# Patient Record
Sex: Female | Born: 1935 | Race: White | Hispanic: No | Marital: Married | Smoking: Former smoker
Health system: Southern US, Community
[De-identification: ages and names within clinical notes are randomized; demographics above are authoritative.]

## PROBLEM LIST (undated history)

## (undated) DIAGNOSIS — G459 Transient cerebral ischemic attack, unspecified: Secondary | ICD-10-CM

## (undated) DIAGNOSIS — M1990 Unspecified osteoarthritis, unspecified site: Secondary | ICD-10-CM

## (undated) DIAGNOSIS — J45909 Unspecified asthma, uncomplicated: Secondary | ICD-10-CM

## (undated) DIAGNOSIS — I4510 Unspecified right bundle-branch block: Secondary | ICD-10-CM

## (undated) HISTORY — PX: CHOLECYSTECTOMY: SHX55

## (undated) HISTORY — PX: DILATION AND CURETTAGE OF UTERUS: SHX78

## (undated) HISTORY — PX: TONSILLECTOMY: SUR1361

---

## 2012-01-12 ENCOUNTER — Emergency Department (HOSPITAL_COMMUNITY): Payer: Medicare Other

## 2012-01-12 ENCOUNTER — Encounter (HOSPITAL_COMMUNITY): Payer: Self-pay | Admitting: Emergency Medicine

## 2012-01-12 ENCOUNTER — Emergency Department (HOSPITAL_COMMUNITY)
Admission: EM | Admit: 2012-01-12 | Discharge: 2012-01-12 | Disposition: A | Discharge status: Home / Self Care | Payer: Medicare Other | Attending: Emergency Medicine | Admitting: Emergency Medicine

## 2012-01-12 DIAGNOSIS — Z87891 Personal history of nicotine dependence: Secondary | ICD-10-CM | POA: Insufficient documentation

## 2012-01-12 DIAGNOSIS — R200 Anesthesia of skin: Secondary | ICD-10-CM

## 2012-01-12 DIAGNOSIS — R209 Unspecified disturbances of skin sensation: Secondary | ICD-10-CM | POA: Insufficient documentation

## 2012-01-12 DIAGNOSIS — R202 Paresthesia of skin: Secondary | ICD-10-CM

## 2012-01-12 DIAGNOSIS — M129 Arthropathy, unspecified: Secondary | ICD-10-CM | POA: Insufficient documentation

## 2012-01-12 DIAGNOSIS — J45909 Unspecified asthma, uncomplicated: Secondary | ICD-10-CM | POA: Insufficient documentation

## 2012-01-12 HISTORY — DX: Unspecified asthma, uncomplicated: J45.909

## 2012-01-12 HISTORY — DX: Unspecified osteoarthritis, unspecified site: M19.90

## 2012-01-12 LAB — CBC WITH DIFFERENTIAL/PLATELET
Basophils Absolute: 0 10*3/uL (ref 0.0–0.1)
Basophils Relative: 0 % (ref 0–1)
Lymphocytes Relative: 23 % (ref 12–46)
MCHC: 34.5 g/dL (ref 30.0–36.0)
Monocytes Absolute: 0.6 10*3/uL (ref 0.1–1.0)
Neutro Abs: 3.8 10*3/uL (ref 1.7–7.7)
Neutrophils Relative %: 64 % (ref 43–77)
Platelets: 287 10*3/uL (ref 150–400)
RDW: 12.7 % (ref 11.5–15.5)
WBC: 6 10*3/uL (ref 4.0–10.5)

## 2012-01-12 LAB — BASIC METABOLIC PANEL
CO2: 24 mEq/L (ref 19–32)
Chloride: 99 mEq/L (ref 96–112)
Creatinine, Ser: 0.66 mg/dL (ref 0.50–1.10)
GFR calc Af Amer: 88 mL/min — ABNORMAL LOW (ref >90)
Potassium: 4 mEq/L (ref 3.5–5.1)
Sodium: 136 mEq/L (ref 135–145)

## 2012-01-12 LAB — APTT: aPTT: 30 seconds (ref 24–37)

## 2012-01-12 LAB — POCT I-STAT TROPONIN I: Troponin i, poc: 0 ng/mL (ref 0.00–0.08)

## 2012-01-12 LAB — PROTIME-INR: Prothrombin Time: 12.5 seconds (ref 11.6–15.2)

## 2012-01-12 NOTE — ED Notes (Signed)
Pt presents to department for evaluation of tingling/numbness to R side of body. Pt reports numbness to R side of face, neck arms, and legs. Onset while at the movie theatre. No neurological deficits noted at the time. Strong equal bilateral grip strengths. No facial droop noted. Speech clear. Denies pain at the time. Pt is conscious alert and oriented x4.

## 2012-01-12 NOTE — ED Notes (Signed)
Pt discharged home. Had no further questions. 

## 2012-01-12 NOTE — ED Provider Notes (Signed)
History     CSN: [id number]  Arrival date & time 01/12/12  1427   First MD Initiated Contact with Patient 01/12/12 1517      Chief Complaint  Patient presents with  . Numbness    (Consider location/radiation/quality/duration/timing/severity/associated sxs/prior treatment) HPI Sudden onset of right arm numbness while watching a movie with family.  She said she all the sudden she didn't feel well. Family who was with patient states she "didn't look well." Pt denies any pain. The location of the patient's problem is right arm.  Onset was 1-2 hours ago with resolved course since that time.   Modifying factors:  none.  Associated symptoms: no chest pain, no N/V, no arm or leg weakness, no difficulty speaking.   Pt is from [place] visiting family here and has a cardiologist there. Family reports nuclear medicine stress test approximately two years ago reported to be unremarkable. Pt is on plavix for h/o of TIA.    Past Medical History  Diagnosis Date  . Asthma   . Arthritis     Past Surgical History  Procedure Date  . Dilation and curettage of uterus   . Cholecystectomy   . Tonsillectomy     No family history on file.  History  Substance Use Topics  . Smoking status: Former Games developer  . Smokeless tobacco: Not on file  . Alcohol Use: Yes    OB History    Grav Para Term Preterm Abortions TAB SAB Ect Mult Living                  Review of Systems Negative for respiratory distress, cough. Negative for vomiting, diarrhea.  All other systems reviewed and negative unless noted in HPI.    Allergies  Review of patient's allergies indicates not on file.  Home Medications  No current outpatient prescriptions on file.  BP 166/70  Pulse 87  Temp 97.7 F (36.5 C) (Oral)  Resp 18  SpO2 97%  Physical Exam Nursing note and vitals reviewed.  Constitutional: Pt is alert and appears stated age. Oropharynx: Airway open without erythema or exudate. Respiratory: No  respiratory distress. Equal breathing bilaterally. CV: Extremities warm and well perfused. Neuro: GCS 15. No motor nor sensory deficit. Normal coordination. CN II-XII intact.  Head: Normocephalic and atraumatic. Eyes: No conjunctivitis, no scleral icterus. Neck: Supple, no mass. Chest: Non-tender. Abdomen: Soft, non-tender MSK: Extremities are atraumatic without deformity. Skin: No rash, no wounds.  ED Course  Procedures (including critical care time)   Labs Reviewed  CBC WITH DIFFERENTIAL  APTT  PROTIME-INR  POCT I-STAT TROPONIN I  BASIC METABOLIC PANEL  URINALYSIS, ROUTINE W REFLEX MICROSCOPIC   Dg Chest 2 View  01/12/2012  *RADIOLOGY REPORT*  Clinical Data: Right arm numbness  CHEST - 2 VIEW  Comparison: None.  Findings: Cardiomediastinal silhouette is unremarkable.  Mild hyperinflation.  Mild degenerative changes thoracic spine. Tortuous descending aorta.  Metallic anchors are noted right proximal humerus. No acute infiltrate or pulmonary edema.  IMPRESSION: No acute disease.  Mild hyperinflation.   Original Report Authenticated By: Natasha Mead, M.D.    Ct Head Wo Contrast  01/12/2012  *RADIOLOGY REPORT*  Clinical Data: Numbness and tingling to right side of face and right upper extremity.  CT HEAD WITHOUT CONTRAST  Technique:  Contiguous axial images were obtained from the base of the skull through the vertex without contrast.  Comparison: None.  Findings: The brain shows cortical atrophy, primarily affecting the frontal lobes bilaterally.  The brain demonstrates  no evidence of hemorrhage, infarction, edema, mass effect, extra-axial fluid collection, hydrocephalus or mass lesion.  The skull is unremarkable.  IMPRESSION: No acute findings.   Original Report Authenticated By: Irish Lack, M.D.      1. Numbness and tingling       MDM  78 y.o. female here with numbness. No weakness, slurred speech, or AMS. Also, no chest pain or SOB.  Pertinent past problems include TIA on  plavix, HTN, HL. Currently at baseline. Work up completed with unremarkable EKG, CXR, CT head, labs. Plan to do second troponin.   EKG ordered and interpreted by me:  Date: 01/12/2012  Rate: 64  Rhythm: normal sinus rhythm  QRS Axis: normal  Intervals: normal  ST/T Wave abnormalities: normal right bundle-branch block, left atrial hypertrophy. No prior ECG available for comparison.  Conduction Disutrbances:right bundle branch block  Narrative Interpretation:  Old EKG Reviewed: none available    Course of care: Second troponin returned low. On re-eval, pt resting comfortably, NAD. Discussed results with pt and family member at bedside. They will be able to obtain close follow up care. Feel pt is stable for d/c. Counseling provided regarding diagnosis, treatment plan, follow up recommendations, and return precautions. Questions answered.   Medical Decision Making discussed with ED attending Dione Booze, MD          Charm Barges, MD 01/13/12 0005

## 2012-01-12 NOTE — ED Notes (Signed)
Pt in x-ray at the time. 

## 2012-01-12 NOTE — ED Provider Notes (Signed)
78 year old female developed numbness in the right arm while in a movie theater. Numbness lasted about 20-30 minutes before resolving. There is also a flushed sensation in her face which has not resulted denies chest pain, heaviness, tightness, pressure. She denies dyspnea. She denies weakness. She did have a TIA approximately 4 months ago which was manifest by right eye vision changes. Work appears unremarkable. Exam shows normal neurologic exam including no pronator drift and no extinction on double simultaneous stimulation. Fundi show no hemorrhage, exudate, or papilledema. There no carotid bruits. She will have troponin rechecked and if normal, discharged to follow up with her PCP in [place].   Date: 01/12/2012  Rate: 64  Rhythm: normal sinus rhythm  QRS Axis: normal  Intervals: normal  ST/T Wave abnormalities: normal right bundle-branch block, left atrial hypertrophy. No prior ECG available for comparison.  Conduction Disutrbances:right bundle branch block  Narrative Interpretation:   Old EKG Reviewed: none available  I saw and evaluated the patient, reviewed the resident's note and I agree with the findings and plan.   Dione Booze, MD 01/12/12 [phone]

## 2012-01-12 NOTE — ED Notes (Signed)
Pt reports numbness and tingling to right side of face. Pt also reports tingling in right arm and hands. Pt talking in complete sentences without difficulty. Dr. Anitra Lauth in Triage room evaluating patient.

## 2012-01-12 NOTE — ED Provider Notes (Signed)
Patient presenting with right arm numbness that started less than 1 hour ago. She denies any weakness or numbness in the face or leg. On exam she has normal strength and states she was ambulating normally. She has some mild neck tenderness but denies any chest pain. At this point in time do not feel that patient is a code stroke as her only her right arm is affected there is no weakness and no gait abnormalities. Will do a workup with a CBC, BMP, i-STAT troponin, UA, chest x-ray, EKG and head CT for further evaluation for silent cardiac cause versus neurologic.  Gwyneth Sprout, MD 01/12/12 [phone]

## 2012-01-12 NOTE — ED Notes (Signed)
Pt unable to void at the time. 

## 2013-04-22 ENCOUNTER — Encounter (HOSPITAL_COMMUNITY): Payer: Self-pay | Admitting: Emergency Medicine

## 2013-04-22 ENCOUNTER — Observation Stay (HOSPITAL_COMMUNITY)
Admission: EM | Admit: 2013-04-22 | Discharge: 2013-04-25 | Disposition: A | Discharge status: Home / Self Care | Payer: Medicare Other | Attending: Family Medicine | Admitting: Family Medicine

## 2013-04-22 ENCOUNTER — Emergency Department (HOSPITAL_COMMUNITY): Payer: Medicare Other

## 2013-04-22 DIAGNOSIS — I4510 Unspecified right bundle-branch block: Secondary | ICD-10-CM | POA: Insufficient documentation

## 2013-04-22 DIAGNOSIS — R55 Syncope and collapse: Principal | ICD-10-CM | POA: Diagnosis present

## 2013-04-22 DIAGNOSIS — R0602 Shortness of breath: Secondary | ICD-10-CM | POA: Insufficient documentation

## 2013-04-22 DIAGNOSIS — R232 Flushing: Secondary | ICD-10-CM | POA: Insufficient documentation

## 2013-04-22 DIAGNOSIS — R109 Unspecified abdominal pain: Secondary | ICD-10-CM | POA: Insufficient documentation

## 2013-04-22 DIAGNOSIS — R002 Palpitations: Secondary | ICD-10-CM | POA: Diagnosis present

## 2013-04-22 DIAGNOSIS — Z87891 Personal history of nicotine dependence: Secondary | ICD-10-CM | POA: Insufficient documentation

## 2013-04-22 DIAGNOSIS — E785 Hyperlipidemia, unspecified: Secondary | ICD-10-CM | POA: Diagnosis present

## 2013-04-22 DIAGNOSIS — I10 Essential (primary) hypertension: Secondary | ICD-10-CM | POA: Diagnosis present

## 2013-04-22 DIAGNOSIS — R42 Dizziness and giddiness: Secondary | ICD-10-CM | POA: Insufficient documentation

## 2013-04-22 DIAGNOSIS — Z8673 Personal history of transient ischemic attack (TIA), and cerebral infarction without residual deficits: Secondary | ICD-10-CM | POA: Insufficient documentation

## 2013-04-22 DIAGNOSIS — Z79899 Other long term (current) drug therapy: Secondary | ICD-10-CM | POA: Insufficient documentation

## 2013-04-22 DIAGNOSIS — F411 Generalized anxiety disorder: Secondary | ICD-10-CM | POA: Insufficient documentation

## 2013-04-22 DIAGNOSIS — R000 Tachycardia, unspecified: Secondary | ICD-10-CM | POA: Insufficient documentation

## 2013-04-22 DIAGNOSIS — Z7902 Long term (current) use of antithrombotics/antiplatelets: Secondary | ICD-10-CM | POA: Insufficient documentation

## 2013-04-22 DIAGNOSIS — J45909 Unspecified asthma, uncomplicated: Secondary | ICD-10-CM | POA: Insufficient documentation

## 2013-04-22 HISTORY — DX: Transient cerebral ischemic attack, unspecified: G45.9

## 2013-04-22 HISTORY — DX: Unspecified right bundle-branch block: I45.10

## 2013-04-22 LAB — BASIC METABOLIC PANEL
BUN: 15 mg/dL (ref 6–23)
CHLORIDE: 97 meq/L (ref 96–112)
CO2: 24 meq/L (ref 19–32)
CREATININE: 0.68 mg/dL (ref 0.50–1.10)
Calcium: 9.5 mg/dL (ref 8.4–10.5)
GFR calc non Af Amer: 75 mL/min — ABNORMAL LOW (ref >90)
GFR, EST AFRICAN AMERICAN: 87 mL/min — AB (ref >90)
Glucose, Bld: 91 mg/dL (ref 70–99)
POTASSIUM: 3.9 meq/L (ref 3.7–5.3)
SODIUM: 135 meq/L — AB (ref 137–147)

## 2013-04-22 LAB — TROPONIN I

## 2013-04-22 LAB — URINALYSIS, ROUTINE W REFLEX MICROSCOPIC
Bilirubin Urine: NEGATIVE
GLUCOSE, UA: NEGATIVE mg/dL
Hgb urine dipstick: NEGATIVE
Ketones, ur: NEGATIVE mg/dL
NITRITE: NEGATIVE
PH: 6 (ref 5.0–8.0)
Protein, ur: NEGATIVE mg/dL
SPECIFIC GRAVITY, URINE: 1.005 (ref 1.005–1.030)
Urobilinogen, UA: 0.2 mg/dL (ref 0.0–1.0)

## 2013-04-22 LAB — CBC WITH DIFFERENTIAL/PLATELET
BASOS ABS: 0 10*3/uL (ref 0.0–0.1)
Basophils Relative: 1 % (ref 0–1)
Eosinophils Absolute: 0.1 10*3/uL (ref 0.0–0.7)
Eosinophils Relative: 3 % (ref 0–5)
HEMATOCRIT: 38.7 % (ref 36.0–46.0)
Hemoglobin: 13 g/dL (ref 12.0–15.0)
LYMPHS PCT: 23 % (ref 12–46)
Lymphs Abs: 1 10*3/uL (ref 0.7–4.0)
MCH: 29.7 pg (ref 26.0–34.0)
MCHC: 33.6 g/dL (ref 30.0–36.0)
MCV: 88.4 fL (ref 78.0–100.0)
MONO ABS: 0.5 10*3/uL (ref 0.1–1.0)
Monocytes Relative: 12 % (ref 3–12)
NEUTROS ABS: 2.6 10*3/uL (ref 1.7–7.7)
NEUTROS PCT: 61 % (ref 43–77)
Platelets: 240 10*3/uL (ref 150–400)
RBC: 4.38 MIL/uL (ref 3.87–5.11)
RDW: 13 % (ref 11.5–15.5)
WBC: 4.3 10*3/uL (ref 4.0–10.5)

## 2013-04-22 LAB — URINE MICROSCOPIC-ADD ON

## 2013-04-22 MED ORDER — VITAMIN D 1000 UNITS PO TABS
2000.0000 [IU] | ORAL_TABLET | Freq: Every day | ORAL | Status: DC
  Administered 2013-04-22 – 2013-04-24 (×3): 2000 [IU] via ORAL
  Filled 2013-04-22 (×4): qty 2

## 2013-04-22 MED ORDER — ONDANSETRON HCL 4 MG/2ML IJ SOLN: 4.0000 mg | Freq: Four times a day (QID) | INTRAMUSCULAR | Status: DC | PRN

## 2013-04-22 MED ORDER — SIMVASTATIN 40 MG PO TABS
40.0000 mg | ORAL_TABLET | Freq: Every day | ORAL | Status: DC
  Filled 2013-04-22: qty 1

## 2013-04-22 MED ORDER — ACETAMINOPHEN 650 MG RE SUPP: 650.0000 mg | Freq: Four times a day (QID) | RECTAL | Status: DC | PRN

## 2013-04-22 MED ORDER — ENOXAPARIN SODIUM 40 MG/0.4ML ~~LOC~~ SOLN
40.0000 mg | Freq: Every day | SUBCUTANEOUS | Status: DC
  Administered 2013-04-22 – 2013-04-24 (×3): 40 mg via SUBCUTANEOUS
  Filled 2013-04-22 (×4): qty 0.4

## 2013-04-22 MED ORDER — NITROGLYCERIN 0.4 MG SL SUBL: 0.4000 mg | SUBLINGUAL_TABLET | SUBLINGUAL | Status: DC | PRN

## 2013-04-22 MED ORDER — CLOPIDOGREL BISULFATE 75 MG PO TABS
75.0000 mg | ORAL_TABLET | Freq: Every day | ORAL | Status: DC
  Administered 2013-04-22 – 2013-04-24 (×3): 75 mg via ORAL
  Filled 2013-04-22 (×4): qty 1

## 2013-04-22 MED ORDER — LORATADINE 10 MG PO TABS
10.0000 mg | ORAL_TABLET | Freq: Every day | ORAL | Status: DC | PRN
  Filled 2013-04-22: qty 1

## 2013-04-22 MED ORDER — ATENOLOL 12.5 MG HALF TABLET
12.5000 mg | ORAL_TABLET | Freq: Every day | ORAL | Status: DC
  Administered 2013-04-22 – 2013-04-24 (×3): 12.5 mg via ORAL
  Filled 2013-04-22 (×4): qty 1

## 2013-04-22 MED ORDER — SODIUM CHLORIDE 0.9 % IV SOLN
INTRAVENOUS | Status: DC
  Administered 2013-04-22: 23:00:00 via INTRAVENOUS

## 2013-04-22 MED ORDER — ACETAMINOPHEN 325 MG PO TABS: 650.0000 mg | ORAL_TABLET | Freq: Four times a day (QID) | ORAL | Status: DC | PRN

## 2013-04-22 MED ORDER — AMLODIPINE BESYLATE 2.5 MG PO TABS
2.5000 mg | ORAL_TABLET | Freq: Two times a day (BID) | ORAL | Status: DC
  Administered 2013-04-22 – 2013-04-25 (×6): 2.5 mg via ORAL
  Filled 2013-04-22 (×7): qty 1

## 2013-04-22 MED ORDER — ONDANSETRON HCL 4 MG PO TABS: 4.0000 mg | ORAL_TABLET | Freq: Four times a day (QID) | ORAL | Status: DC | PRN

## 2013-04-22 NOTE — ED Notes (Addendum)
Pt c/o increased lightheadedness and heart palpitations this afternoon.  Pt sts "I just don't feel well.  I haven't felt well for 6 weeks."  Pt reports intermittent "spells for a while."  Denies pain.  Pt reported to daughter's partner that she felt as if her "heart was jumping around."

## 2013-04-22 NOTE — H&P (Signed)
[facility] History and Physical  Jeanette Arroyo ZOX:[id number] DOB: 1920/06/14 DOA: 04/22/2013  Referring physician: ER physician. PCP: Provider Not In System   Chief Complaint: Palpitations and dizziness.  HPI: Jeanette Arroyo is a 80 y.o. female with history of hypertension, hyperlipidemia and previous history of TIA presented to the ER because of sensation of almost passing out with palpitations. Patient states that since morning patient has not been feeling well. 2 the evening patient's friend who is an internist check patient's depression was found to be high and patient's pulse was high with irregularly irregular rhythm. Patient states that patient's sensation of almost passing out happened same time and this palpitations and the symptom lasted for 10 minutes. In the ER patient's EKG showed normal sinus rhythm with RBBB. Patient presently is asymptomatic. Patient has been admitted for further management. Patient denies any headache visual symptoms focal deficits difficulty talking swallowing. Denies any chest pain shortness of breath nausea vomiting abdominal pain difficulty hearing or any tinnitus.   Review of Systems: As presented in the history of presenting illness, rest negative.  Past Medical History  Diagnosis Date  . Asthma   . Arthritis   . TIA (transient ischemic attack)   . Right bundle branch block    Past Surgical History  Procedure Laterality Date  . Dilation and curettage of uterus    . Cholecystectomy    . Tonsillectomy     Social History:  reports that she has quit smoking. She does not have any smokeless tobacco history on file. She reports that she drinks alcohol. She reports that she does not use illicit drugs. Where does patient live home. Can patient participate in ADLs? Yes.  No Known Allergies  Family History:  Family History  Problem Relation Age of Onset  . CAD Mother       Prior to Admission medications   Medication Sig Start Date End  Date Taking? Authorizing Provider  amLODipine (NORVASC) 5 MG tablet Take 2.5 mg by mouth 2 (two) times daily.   Yes Historical Provider, MD  atenolol (TENORMIN) 25 MG tablet Take 12.5 mg by mouth at bedtime.   Yes Historical Provider, MD  Cholecalciferol (VITAMIN D3) 2000 UNITS capsule Take 2,000 Units by mouth at bedtime.   Yes Historical Provider, MD  clopidogrel (PLAVIX) 75 MG tablet Take 75 mg by mouth at bedtime.   Yes Historical Provider, MD  loratadine (CLARITIN) 10 MG tablet Take 10 mg by mouth daily as needed. For allergies   Yes Historical Provider, MD  nitroGLYCERIN (NITROSTAT) 0.4 MG SL tablet Place 0.4 mg under the tongue every 5 (five) minutes as needed. For chest pain   Yes Historical Provider, MD  pravastatin (PRAVACHOL) 80 MG tablet Take 80 mg by mouth at bedtime.   Yes Historical Provider, MD    Physical Exam: Filed Vitals:   04/22/13 1849 04/22/13 1851 04/22/13 2053 04/22/13 2157  BP: 152/72 147/73 125/67 135/63  Pulse: 73 78 73 73  Temp:      TempSrc:      Resp:   19 21  SpO2:   96% 97%     General:  Well-developed well-nourished.  Eyes: Anicteric no pallor. No nystagmus.  ENT: No discharge from the ears eyes nose mouth.  Neck: No mass felt. JVD not appreciated  Cardiovascular: S1-S2 heard. Regular.  Respiratory: No rhonchi or crepitations.  Abdomen: Soft nontender bowel sounds present. No guarding rigidity.  Skin: No rash.  Musculoskeletal: No edema.  Psychiatric: Appears normal.  Neurologic:  Alert and oriented to time place and person. Moves all extremities.  Labs on Admission:  Basic Metabolic Panel:  Recent Labs Lab 04/22/13 1847  NA 135*  K 3.9  CL 97  CO2 24  GLUCOSE 91  BUN 15  CREATININE 0.68  CALCIUM 9.5   Liver Function Tests: No results found for this basename: AST, ALT, ALKPHOS, BILITOT, PROT, ALBUMIN,  in the last 168 hours No results found for this basename: LIPASE, AMYLASE,  in the last 168 hours No results found for  this basename: AMMONIA,  in the last 168 hours CBC:  Recent Labs Lab 04/22/13 1847  WBC 4.3  NEUTROABS 2.6  HGB 13.0  HCT 38.7  MCV 88.4  PLT 240   Cardiac Enzymes:  Recent Labs Lab 04/22/13 1847  TROPONINI <0.30    BNP (last 3 results) No results found for this basename: PROBNP,  in the last 8760 hours CBG: No results found for this basename: GLUCAP,  in the last 168 hours  Radiological Exams on Admission: Dg Chest Port 1 View  04/22/2013   CLINICAL DATA:  Shortness of breath.  EXAM: PORTABLE CHEST - 1 VIEW  COMPARISON:  January 12, 2012.  FINDINGS: The heart size and mediastinal contours are within normal limits. Both lungs are clear. No pleural effusion or pneumothorax is noted. Postsurgical changes are seen involving the right shoulder. Degenerative changes seen involving the left glenohumeral joint.  IMPRESSION: No acute cardiopulmonary abnormality seen.   Electronically Signed   By: Roque LiasJames  Green M.D.   On: 04/22/2013 19:27    EKG: Independently reviewed. Normal sinus rhythm with RBBB.  Assessment/Plan Principal Problem:   Near syncope Active Problems:   HTN (hypertension)   HLD (hyperlipidemia)   Palpitations   1. Near-syncope - given patient's history of palpitations at this time we will closely monitor in telemetry for any arrhythmias. Check 2-D echo. Check thyroid function tests since patient had palpitations to rule out hyperthyroidism. Check orthostatics. 2. Palpitations - see #1. 3. Hypertension - continue present medications. 4. Hyperlipidemia - continue present medications. 5. History of TIA - on Plavix.    Code Status: Full code.  Family Communication: Patient's daughter.  Disposition Plan: Admit for observation.    Adelyn Roscher N. [facility] Pager [phone].  If 7PM-7AM, please contact night-coverage [email or web address] Password [email or web address] 04/22/2013, 10:04 PM

## 2013-04-22 NOTE — ED Notes (Signed)
Patient is aware that a urine sample is needed.  

## 2013-04-22 NOTE — ED Provider Notes (Signed)
CSN: [id number]     Arrival date & time 04/22/13  1752 History   First MD Initiated Contact with Patient 04/22/13 1802     Chief Complaint  Patient presents with  . Dizziness  . Palpitations     (Consider location/radiation/quality/duration/timing/severity/associated sxs/prior Treatment) Patient is a 80 y.o. female presenting with dizziness and palpitations.  Dizziness Quality:  Lightheadedness Severity:  Moderate Onset quality:  Sudden Timing:  Constant Progression:  Partially resolved Chronicity:  New Context comment:  Reports feeling not herself since getting the flu six weeks ago. Relieved by: rest. Associated symptoms: palpitations   Associated symptoms: no chest pain, no diarrhea, no nausea, no shortness of breath, no syncope and no vomiting   Palpitations Palpitations quality:  Fast Onset quality:  Sudden Timing:  Intermittent Associated symptoms: dizziness   Associated symptoms: no chest pain, no cough, no nausea, no shortness of breath, no syncope and no vomiting     Past Medical History  Diagnosis Date  . Asthma   . Arthritis   . TIA (transient ischemic attack)   . Right bundle branch block    Past Surgical History  Procedure Laterality Date  . Dilation and curettage of uterus    . Cholecystectomy    . Tonsillectomy     History reviewed. No pertinent family history. History  Substance Use Topics  . Smoking status: Former Games developer  . Smokeless tobacco: Not on file  . Alcohol Use: Yes   OB History   Grav Para Term Preterm Abortions TAB SAB Ect Mult Living                 Review of Systems  Constitutional: Negative for fever.  HENT: Negative for congestion.   Respiratory: Negative for cough and shortness of breath.   Cardiovascular: Positive for palpitations. Negative for chest pain and syncope.  Gastrointestinal: Negative for nausea, vomiting, abdominal pain and diarrhea.  Neurological: Positive for dizziness.  All other systems reviewed and are  negative.      Allergies  Review of patient's allergies indicates no known allergies.  Home Medications   Current Outpatient Rx  Name  Route  Sig  Dispense  Refill  . amLODipine (NORVASC) 5 MG tablet   Oral   Take 2.5 mg by mouth 2 (two) times daily.         Marland Kitchen atenolol (TENORMIN) 25 MG tablet   Oral   Take 12.5 mg by mouth at bedtime.         . Cholecalciferol (VITAMIN D3) 2000 UNITS capsule   Oral   Take 2,000 Units by mouth at bedtime.         . clopidogrel (PLAVIX) 75 MG tablet   Oral   Take 75 mg by mouth at bedtime.         Marland Kitchen loratadine (CLARITIN) 10 MG tablet   Oral   Take 10 mg by mouth daily as needed. For allergies         . nitroGLYCERIN (NITROSTAT) 0.4 MG SL tablet   Sublingual   Place 0.4 mg under the tongue every 5 (five) minutes as needed. For chest pain         . pravastatin (PRAVACHOL) 80 MG tablet   Oral   Take 80 mg by mouth at bedtime.          BP 156/74  Pulse 81  Temp(Src) 98.6 F (37 C) (Oral)  Resp 15  SpO2 97% Physical Exam  Nursing note and vitals reviewed. Constitutional: She  is oriented to person, place, and time. She appears well-developed and well-nourished. No distress.  HENT:  Head: Normocephalic and atraumatic.  Right Ear: Tympanic membrane and external ear normal.  Left Ear: Tympanic membrane and external ear normal.  Mouth/Throat: Oropharynx is clear and moist.  Bilateral hearing aids  Eyes: Conjunctivae are normal. Pupils are equal, round, and reactive to light. No scleral icterus.  Neck: Neck supple.  Cardiovascular: Normal rate, regular rhythm, normal heart sounds and intact distal pulses.   No murmur heard. Pulmonary/Chest: Effort normal and breath sounds normal. No stridor. No respiratory distress. She has no rales.  Abdominal: Soft. Bowel sounds are normal. She exhibits no distension. There is no tenderness.  Musculoskeletal: Normal range of motion.  Neurological: She is alert and oriented to person,  place, and time.  Skin: Skin is warm and dry. No rash noted.  Psychiatric: She has a normal mood and affect. Her behavior is normal.    ED Course  Procedures (including critical care time) Labs Review Labs Reviewed  BASIC METABOLIC PANEL - Abnormal; Notable for the following:    Sodium 135 (*)    GFR calc non Af Amer 75 (*)    GFR calc Af Amer 87 (*)    All other components within normal limits  CBC WITH DIFFERENTIAL  TROPONIN I  URINALYSIS, ROUTINE W REFLEX MICROSCOPIC  TSH  T4, FREE   Imaging Review Dg Chest Port 1 View  04/22/2013   CLINICAL DATA:  Shortness of breath.  EXAM: PORTABLE CHEST - 1 VIEW  COMPARISON:  January 12, 2012.  FINDINGS: The heart size and mediastinal contours are within normal limits. Both lungs are clear. No pleural effusion or pneumothorax is noted. Postsurgical changes are seen involving the right shoulder. Degenerative changes seen involving the left glenohumeral joint.  IMPRESSION: No acute cardiopulmonary abnormality seen.   Electronically Signed   By: Roque LiasJames  Green M.D.   On: 04/22/2013 19:27  All radiology studies independently viewed by me.     EKG Interpretation    Date/Time:  Thursday April 22 2013 18:02:01 EST Ventricular Rate:  83 PR Interval:  171 QRS Duration: 156 QT Interval:  424 QTC Calculation: 498 R Axis:   31 Text Interpretation:  Sinus rhythm Right bundle branch block No significant change was found Confirmed by [facility]OFF[facility]  MD, TREY ([id number]) on 04/22/2013 6:33:26 PM            MDM   Final diagnoses:  Palpitation  Near syncope    80 yo female with palpitations and near syncope.  No chest pain or SOB.  Well appearing on my exam.  A family member (who is an Administrator, artsinternist) reported feeling an irregularly irregular heart rate while she was complaining of palpitations.    EKG unchanged. Chest x-ray negative. Blood work unremarkable.  Have consulted internal medicine for admission to telemetry bed.  Candyce ChurnJohn David Cybele Maule III,  MD 04/22/13 [phone]

## 2013-04-22 NOTE — ED Notes (Signed)
MD at bedside. 

## 2013-04-22 NOTE — ED Notes (Signed)
MD at bedside, pt will be admitted 

## 2013-04-22 NOTE — ED Notes (Signed)
Hospitalist at bedside at this time 

## 2013-04-23 DIAGNOSIS — I519 Heart disease, unspecified: Secondary | ICD-10-CM

## 2013-04-23 LAB — HEMOGLOBIN A1C
Hgb A1c MFr Bld: 5.4 % (ref <5.7)
MEAN PLASMA GLUCOSE: 108 mg/dL (ref <117)

## 2013-04-23 LAB — TSH: TSH: 2.164 u[IU]/mL (ref 0.350–4.500)

## 2013-04-23 LAB — GLUCOSE, CAPILLARY
GLUCOSE-CAPILLARY: 113 mg/dL — AB (ref 70–99)
GLUCOSE-CAPILLARY: 100 mg/dL — AB (ref 70–99)
Glucose-Capillary: 104 mg/dL — ABNORMAL HIGH (ref 70–99)

## 2013-04-23 LAB — T4, FREE: FREE T4: 1.35 ng/dL (ref 0.80–1.80)

## 2013-04-23 MED ORDER — PRAVASTATIN SODIUM 40 MG PO TABS
80.0000 mg | ORAL_TABLET | Freq: Every day | ORAL | Status: DC
  Administered 2013-04-23 – 2013-04-24 (×2): 80 mg via ORAL
  Filled 2013-04-23 (×3): qty 2

## 2013-04-23 MED ORDER — VITAMINS A & D EX OINT
TOPICAL_OINTMENT | CUTANEOUS | Status: AC
  Administered 2013-04-23: 5
  Filled 2013-04-23: qty 5

## 2013-04-23 NOTE — Progress Notes (Signed)
Pt ambulated in hall to check for any change in heart rate and rhythm. CMT verified that there were no abnormal beats or rhythms during ambulation. Julio SicksK. Guiseppe Flanagan RN

## 2013-04-23 NOTE — Progress Notes (Signed)
UR completed 

## 2013-04-23 NOTE — Progress Notes (Signed)
  Echocardiogram 2D Echocardiogram has been performed.  Jorje GuildCHUNG, Almarie Kurdziel 04/23/2013, 10:17 AM

## 2013-04-23 NOTE — Progress Notes (Signed)
11:21 AM I agree with HPI/GPe and A/P per Dr. Toniann FailKakrakandy      Pleasant 80 year old Caucasian female, known history TIA x2-currently on Plavix, last 1 2013, hyperlipidemia, hypertension, prior history palpitations and reported history per daughter of CAD [place] overnight with near syncopal episode  Daughter reports that patient had an episode yesterday evening where she had flushing of her face and tachycardia. Patient's daughter's partner is a physician and noted that this was irregularly irregular and patient was promptly brought to the emergency room. In the emergency room workup revealed  completely normal labs point-of-care troponin was negative EKG showed right bundle without change from 11 12 2013  She feels fine today and claims to have "anxiety episodes" in the past with tachycardia and high blood pressure-her daughter concurs that she's had a stroke on one occasion when her blood pressure was really high. Patient herself states that when these episodes happen, food usually helps this to resolve. Right now her telemetry shows sinus bradycardia in the 50s without any red alarms  I have encouraged the patient to ambulate today and told her that I am worried that she may have undiagnosed episodes of hypoglycemia and we will check an A1c, CBG 4 times a day a.c. at bedtime as well as keep on telemetry monitors I discussed clearly with her daughter about echocardiogram does not show any acute change other than mild diastolic dysfunction She has no focal neurological episodes of findings and would not at this stage benefit from MRI testing   I will obtain cardiology notes from [place] to determine what has been done in the past  HEENT frail but younger than stated age CHEST clear CARDIAC S1-S2 no murmur rub or gallop ABDOMEN soft nontender NEURO range motion intact power 5/5 reflexes 2/3 gait not assessed  Patient Active Problem List    Diagnosis Date Noted  . Near syncope 04/22/2013  . HTN (hypertension) 04/22/2013  . HLD (hyperlipidemia) 04/22/2013  . Palpitations 04/22/2013   Pleas KochJai Emelly Wurtz, MD [facility] [phone]) [phone]

## 2013-04-24 LAB — GLUCOSE, CAPILLARY
GLUCOSE-CAPILLARY: 100 mg/dL — AB (ref 70–99)
Glucose-Capillary: 105 mg/dL — ABNORMAL HIGH (ref 70–99)
Glucose-Capillary: 107 mg/dL — ABNORMAL HIGH (ref 70–99)
Glucose-Capillary: 108 mg/dL — ABNORMAL HIGH (ref 70–99)

## 2013-04-24 MED ORDER — BUSPIRONE HCL 5 MG PO TABS
5.0000 mg | ORAL_TABLET | Freq: Two times a day (BID) | ORAL | Status: DC
  Administered 2013-04-24 – 2013-04-25 (×3): 5 mg via ORAL
  Filled 2013-04-24 (×4): qty 1

## 2013-04-24 NOTE — Consult Note (Signed)
Admit date: 04/22/2013 Referring Physician  Dr. Mahala Menghini Primary Physician Provider Not In System Primary Cardiologist  [facility] Reason for Consultation  palpitations, possibly arrhythmia, possible syncope  HPI: 80 year-old female with history of TIA x2 on Plavix, hypertension, hyperlipidemia, palpitations who was admitted with near syncope. On 04/22/13 she began to feel flushing of her face and rapid pulse. She feels a gassy feeling or tense feeling in her stomach. She attributes this to anxiety. The pulse was thought to be irregularly irregular , possibly atrial fibrillation, and this was auscultated by her daughter's partner, Erie Noe, an Administrator, arts. She has been staying with her daughter here in [place]. She had the flu in January and has had some difficulty recovering from this, UTI, sinus. She went to the emergency room. Normal labs, troponin. Right bundle branch block on EKG, no change from 2013.  She has had in the past anxiety attacks and according to daughter, had a stroke one time and her blood pressure was significantly elevated.  Telemetry thus far has not shown any adverse arrhythmias. She thought that she felt her heart racing with abdominal cramping and anxiety this morning but she only had sinus rhythm/sinus bradycardia.    PMH:   Past Medical History  Diagnosis Date  . Asthma   . Arthritis   . TIA (transient ischemic attack)   . Right bundle branch block     PSH:   Past Surgical History  Procedure Laterality Date  . Dilation and curettage of uterus    . Cholecystectomy    . Tonsillectomy     Allergies:  Review of patient's allergies indicates no known allergies. Prior to Admit Meds:   Prior to Admission medications   Medication Sig Start Date End Date Taking? Authorizing Provider  amLODipine (NORVASC) 5 MG tablet Take 2.5 mg by mouth 2 (two) times daily.   Yes Historical Provider, MD  atenolol (TENORMIN) 25 MG tablet Take 12.5 mg by mouth at bedtime.    Yes Historical Provider, MD  Cholecalciferol (VITAMIN D3) 2000 UNITS capsule Take 2,000 Units by mouth at bedtime.   Yes Historical Provider, MD  clopidogrel (PLAVIX) 75 MG tablet Take 75 mg by mouth at bedtime.   Yes Historical Provider, MD  loratadine (CLARITIN) 10 MG tablet Take 10 mg by mouth daily as needed. For allergies   Yes Historical Provider, MD  nitroGLYCERIN (NITROSTAT) 0.4 MG SL tablet Place 0.4 mg under the tongue every 5 (five) minutes as needed. For chest pain   Yes Historical Provider, MD  pravastatin (PRAVACHOL) 80 MG tablet Take 80 mg by mouth at bedtime.   Yes Historical Provider, MD   Fam HX:    Family History  Problem Relation Age of Onset  . CAD Mother    Social HX:    History   Social History  . Marital Status: Married    Spouse Name: N/A    Number of Children: N/A  . Years of Education: N/A   Occupational History  . Not on file.   Social History Main Topics  . Smoking status: Former Games developer  . Smokeless tobacco: Not on file  . Alcohol Use: Yes  . Drug Use: No  . Sexual Activity: Not on file   Other Topics Concern  . Not on file   Social History Narrative  . No narrative on file     ROS:  Denies any recent strokelike symptoms, fevers, chills, orthopnea, PND, rash, bleeding. All 11 ROS were addressed and are negative  except what is stated in the HPI   Physical Exam: Blood pressure 124/57, pulse 59, temperature 97.2 F (36.2 C), temperature source Oral, resp. rate 18, height 5\' 1"  (1.549 m), weight 140 lb 14 oz (63.9 kg), SpO2 98.00%.   General: Frail, elderly, in no acute distress Head: Eyes PERRLA, No xanthomas.   Normal cephalic and atramatic  Lungs:   Clear bilaterally to auscultation and percussion. Normal respiratory effort. No wheezes, no rales. Heart:   HRRR S1 S2 Pulses are 2+ & equal. No murmur, rubs, gallops.  No carotid bruit. No JVD.  No abdominal bruits.  Abdomen: Bowel sounds are positive, abdomen soft and non-tender without  masses. No hepatosplenomegaly. Msk:  Back normal. Normal strength and tone for age. Extremities:  No clubbing, cyanosis or edema.  DP +1 Neuro: Alert and oriented X 3, non-focal, MAE x 4 GU: Deferred Rectal: Deferred Psych:  Good affect, responds appropriately      Labs: Lab Results  Component Value Date   WBC 4.3 04/22/2013   HGB 13.0 04/22/2013   HCT 38.7 04/22/2013   MCV 88.4 04/22/2013   PLT 240 04/22/2013     Recent Labs Lab 04/22/13 1847  NA 135*  K 3.9  CL 97  CO2 24  BUN 15  CREATININE 0.68  CALCIUM 9.5  GLUCOSE 91   TSH and free T4 were normal. Hemoglobin A1c 5.4.  Recent Labs  04/22/13 1847  TROPONINI <0.30     Radiology:  Dg Chest Port 1 View  04/22/2013   CLINICAL DATA:  Shortness of breath.  EXAM: PORTABLE CHEST - 1 VIEW  COMPARISON:  January 12, 2012.  FINDINGS: The heart size and mediastinal contours are within normal limits. Both lungs are clear. No pleural effusion or pneumothorax is noted. Postsurgical changes are seen involving the right shoulder. Degenerative changes seen involving the left glenohumeral joint.  IMPRESSION: No acute cardiopulmonary abnormality seen.   Electronically Signed   By: Roque LiasJames  Green M.D.   On: 04/22/2013 19:27   Personally viewed.  EKG:   Sinus rhythm with right bundle branch block, no other abnormalities, no evidence of AV block. Personally viewed.   Echocardiogram 04/23/13: -Normal ejection fraction, no other significant valvular abnormalities.  ASSESSMENT/PLAN:    80 year old female with near syncopal episode, flushing sensation, possible arrhythmia, palpitations, anxiety.  1. Near-syncope/palpitations- Currently reassuring telemetry. It would not be unreasonable to continue to monitor telemetry for 30 days with event monitor to ensure that she does not have any evidence of atrial fibrillation which if she does, would warrant anticoagulation if she was willing. This can be done either here or back [facility]  [place] where she sees her cardiologist, Dr. Larey Brickowherd. Echocardiogram reassuring. She has an appointment on Wednesday. I think that it would be reasonable to continue to monitor her overnight here in the hospital and if no adverse arrhythmias are identified, but her go home tomorrow to stay with her daughter until Wednesday when she has her appointment. At that point, I would suggest monitoring with event monitor for 30 days to see if we can detect any evidence of atrial fibrillation. I explained to both she and her daughter the implications of atrial fibrillation including anticoagulation. Risks and benefits discussed.  I will be here tomorrow to round.  Donato SchultzSKAINS, Nick Stults, MD  04/24/2013  2:41 PM

## 2013-04-24 NOTE — Progress Notes (Signed)
Note: This document was prepared with digital dictation and possible smart phrase technology. Any transcriptional errors that result from this process are unintentional.   Jeanette Arroyo ZOX:[id number] DOB: [birth year] DOA: 04/22/2013 PCP: Provider Not In System  Brief narrative: Pleasant 80 year old Caucasian female, known history TIA x2-currently on Plavix, last 1 2013, hyperlipidemia, hypertension, prior history palpitations and reported history per daughter of CAD managed conservatively in [place] admitted overnight with near syncopal episode  Daughter reports that patient had an 04/22/13 evening where she had flushing of her face and tachycardia. Patient's daughter's partner is a physician and noted that this was irregularly irregular and patient was promptly brought to the emergency room.  In the emergency room workup revealed  completely normal labs point-of-care troponin was negative  EKG showed right bundle without change from 11 12 2013   She feels fine today and claims to have "anxiety episodes" in the past with tachycardia and high blood pressure-her daughter concurs that she's had a stroke on one occasion when her blood pressure was really high @ OSH  Patient herself states that when these episodes happen, food usually helps this to resolve.    Past medical history-As per Problem list Chart reviewed as below- None in system  Consultants:  Cardiology-[facility]  Procedures:  Echo  Antibiotics:  none   Subjective  Anxious this am Ambulated fine. Had 2 sperate episodes of "Abd cramping, anxiety like feeling' and felt her Heart racing this am Tele reviewed and Called Tele tech-notes only Sinus + sinus brady   Objective    Interim History:   Telemetry: See above   Objective: Filed Vitals:   04/23/13 1338 04/23/13 2111 04/24/13 0533 04/24/13 0945  BP: 120/57 116/56 130/60 119/48  Pulse: 63 55    Temp: 97.3 F (36.3 C) 98.1 F (36.7 C) 97.6 F  (36.4 C)   TempSrc: Oral Oral Oral   Resp: 16 16 16    Height:      Weight:      SpO2: 99% 96% 98%     Intake/Output Summary (Last 24 hours) at 04/24/13 1102 Last data filed at 04/24/13 8119  Gross per 24 hour  Intake    720 ml  Output      0 ml  Net    720 ml    Exam:  General: frail anxious CF in NAD Cardiovascular: s1 s2 brady, no dropped beats, no pauses Respiratory: clear, no added sound Abdomen: soft, NT, ND no rebound or gaurd Skin No LE edema Neuro intact, moves all 4 limbs equally, Power 5/5, Reflexes slighlty brisk, Uvula midline, Smile symm  Data Reviewed: Basic Metabolic Panel:  Recent Labs Lab 04/22/13 1847  NA 135*  K 3.9  CL 97  CO2 24  GLUCOSE 91  BUN 15  CREATININE 0.68  CALCIUM 9.5   Liver Function Tests: No results found for this basename: AST, ALT, ALKPHOS, BILITOT, PROT, ALBUMIN,  in the last 168 hours No results found for this basename: LIPASE, AMYLASE,  in the last 168 hours No results found for this basename: AMMONIA,  in the last 168 hours CBC:  Recent Labs Lab 04/22/13 1847  WBC 4.3  NEUTROABS 2.6  HGB 13.0  HCT 38.7  MCV 88.4  PLT 240   Cardiac Enzymes:  Recent Labs Lab 04/22/13 1847  TROPONINI <0.30   BNP: No components found with this basename: POCBNP,  CBG:  Recent Labs Lab 04/23/13 1211 04/23/13 1650 04/23/13 2108  GLUCAP 113* 104* 100*  No results found for this or any previous visit (from the past 240 hour(s)).   Studies:              All Imaging reviewed and is as per above notation   Scheduled Meds: . amLODipine  2.5 mg Oral BID  . atenolol  12.5 mg Oral QHS  . busPIRone  5 mg Oral BID  . cholecalciferol  2,000 Units Oral QHS  . clopidogrel  75 mg Oral QHS  . enoxaparin (LOVENOX) injection  40 mg Subcutaneous QHS  . pravastatin  80 mg Oral QHS   Continuous Infusions: . sodium chloride 20 mL/hr at 04/22/13 2303     Assessment/Plan: 1. Unexplained episodic tachycardia-DDx anxiety-TSH this  admission 2.1.  Patient may benefit from event monitor although over the past 24-30 hours patient has had no sinus tachycardia or arrhythmia in that patient has had sinus bradycardia.  Cardiology has been consulted 2. Prior history TIA -Continue Plavix 75 each bedtime 3. Episodic isolated systolic hypertension-usually related to when she has these anxiety attacks-continue atenolol 12.5 each bedtime, amlodipine 2.5 daily. Cardiology to comment 4. Hyperlipidemia-continue high intensity statin 80 mg daily pravastatin 5. Osteoporosis continue cholecalciferol 2000 units each bedtime, outpatient review by PCP regarding vitamin D. Levels 6. Reported medically managed CAD-have been unsuccessful in trying to get records from [place] Dr.Cowher[facility].  See above discussion  Code Status: Full Family Communication: Long discussion with daughter at bedside, >20 minutes Disposition Plan: Obs until seen by cardiology   Pleas KochJai Maliaka Brasington, MD  [facility] Pager [phone] 04/24/2013, 11:02 AM    LOS: 2 days

## 2013-04-24 NOTE — Progress Notes (Signed)
Utilization Review completed.  

## 2013-04-25 LAB — GLUCOSE, CAPILLARY: Glucose-Capillary: 119 mg/dL — ABNORMAL HIGH (ref 70–99)

## 2013-04-25 MED ORDER — AMLODIPINE BESYLATE 5 MG PO TABS: 2.5000 mg | ORAL_TABLET | Freq: Two times a day (BID) | ORAL | Status: DC

## 2013-04-25 MED ORDER — BUSPIRONE HCL 5 MG PO TABS: 5.0000 mg | ORAL_TABLET | Freq: Two times a day (BID) | ORAL | Status: DC

## 2013-04-25 MED ORDER — NITROGLYCERIN 0.4 MG SL SUBL: 0.4000 mg | SUBLINGUAL_TABLET | SUBLINGUAL | Status: DC | PRN

## 2013-04-25 MED ORDER — CLOPIDOGREL BISULFATE 75 MG PO TABS: 75.0000 mg | ORAL_TABLET | Freq: Every day | ORAL | Status: AC

## 2013-04-25 MED ORDER — ATENOLOL 12.5 MG HALF TABLET: 12.5000 mg | ORAL_TABLET | Freq: Every day | ORAL | Status: DC

## 2013-04-25 MED ORDER — LORAZEPAM 0.5 MG PO TABS
0.2500 mg | ORAL_TABLET | Freq: Once | ORAL | Status: AC
  Administered 2013-04-25: 0.25 mg via ORAL
  Filled 2013-04-25: qty 1

## 2013-04-25 MED ORDER — NITROGLYCERIN 0.4 MG SL SUBL: 0.4000 mg | SUBLINGUAL_TABLET | SUBLINGUAL | Status: AC | PRN

## 2013-04-25 MED ORDER — PRAVASTATIN SODIUM 80 MG PO TABS: 80.0000 mg | ORAL_TABLET | Freq: Every day | ORAL | Status: DC

## 2013-04-25 NOTE — Progress Notes (Signed)
    Subjective:  Hard to sleep over night. No palps. No CP. No SOB.   Objective:  Vital Signs in the last 24 hours: Temp:  [97.2 F (36.2 C)-97.5 F (36.4 C)] 97.5 F (36.4 C) (02/22 0526) Pulse Rate:  [54-59] 54 (02/22 0526) Resp:  [16-18] 16 (02/22 0526) BP: (110-140)/(46-67) 140/67 mmHg (02/22 0526) SpO2:  [97 %-99 %] 97 % (02/22 0526)  Intake/Output from previous day: 02/21 0701 - 02/22 0700 In: 660 [P.O.:660] Out: -    Physical Exam: General: Elderly but looks younger than stated age, in no acute distress. Head:  Normocephalic and atraumatic. Lungs: Clear to auscultation and percussion. Heart: Normal S1 and S2, Brady Reg.  No murmur, rubs or gallops.  Abdomen: soft, non-tender, positive bowel sounds. Extremities: No clubbing or cyanosis. No edema. Neurologic: Alert and oriented x 3.    Lab Results:  Recent Labs  04/22/13 1847  WBC 4.3  HGB 13.0  PLT 240    Recent Labs  04/22/13 1847  NA 135*  K 3.9  CL 97  CO2 24  GLUCOSE 91  BUN 15  CREATININE 0.68    Recent Labs  04/22/13 1847  TROPONINI <0.30     Telemetry: Sinus brady 50's (no PAC's no AFIB) Personally viewed.   Cardiac Studies:  Normal EF  Assessment/Plan:  Principal Problem:   Near syncope Active Problems:   HTN (hypertension)   HLD (hyperlipidemia)   Palpitations  -no adverse rhythms on tele -recommend follow up with Dr. Larey Brickowherd on Wed in [place] where event monitor can be placed to exclude AFIB. -OK from my standpoint for DC to daughter's home.    Jeanette Arroyo 04/25/2013, 7:40 AM

## 2013-04-25 NOTE — Discharge Summary (Signed)
Physician Discharge Summary  Edson SnowballKathryn Callies AVW:[id number] DOB: Jul 08, 1920 DOA: 04/22/2013  PCP: Provider Not In System  Admit date: 04/22/2013 Discharge date: 04/25/2013  Time spent: 40 minutes  Recommendations for Outpatient Follow-up:  1. Patient recommended to follow up with cardiologist for further management 2. Recommend labs in about one week 3. Recommend Bu[place]par on discharge-PCP to weigh in regarding the same  Discharge Diagnoses:  Principal Problem:   Near syncope Active Problems:   HTN (hypertension)   HLD (hyperlipidemia)   Palpitations   Discharge Condition: Stable  Diet recommendation: Heart healthy  Filed Weights   04/22/13 2214  Weight: 63.9 kg (140 lb 14 oz)    History of present illness:  Pleasant 80 year old Caucasian female, known history TIA x2-currently on Plavix, last 1 2013, hyperlipidemia, hypertension, prior history palpitations and reported history per daughter of CAD managed conservatively in [place] admitted overnight with near syncopal episode  Daughter reports that patient had an 04/22/13 evening where she had flushing of her face and tachycardia. Patient's daughter's partner is a physician and noted that this was irregularly irregular and patient was promptly brought to the emergency room.  In the emergency room workup revealed  completely normal labs point-of-care troponin was negative  EKG showed right bundle without change from 11 12 2013   She feels fine today and on admission stated that she has occasional "anxiety episodes" in the past with tachycardia and high blood pressure-her daughter concurs that she's had a stroke on one occasion when her blood pressure was really high @ OSH   Patient herself states that when these episodes happen, food usually helps this to resolve.   Please see below   Hospital Course:   1. Unexplained episodic tachycardia-DDx anxiety-TSH this admission 2.1. Patient reports that she may have had  these episodes in the past in [place] and this is the main reason why she followed with cardiology. It is unclear as we never got records from the office given it was the weekend--would definitely benefit from event monitor although over the past 48 hours patient has had no sinus tachycardia or arrhythmia in fact the patient has had sinus bradycardia. Cardiology physician concurred with these recommendations and recommended close followup at her regular cardiologist office to have this coordinated. 2. Prior history TIA -Continue Plavix 75 each bedtime 3. Episodic isolated systolic hypertension-usually related to when she has these anxiety attacks-continue atenolol 12.5 each bedtime, amlodipine 2.5 daily.  4. Hyperlipidemia-continue high intensity statin 80 mg daily pravastatin 5. Osteoporosis continue cholecalciferol 2000 units each bedtime, outpatient review by PCP regarding vitamin D. Levels 6. Reported medically managed CAD-have been unsuccessful in trying to get records from [place] Dr[facility].    Consultants:  Cardiology-[facility] Procedures:  Echo Antibiotics:  none   Discharge Exam: Filed Vitals:   04/25/13 0526  BP: 140/67  Pulse: 54  Temp: 97.5 F (36.4 C)  Resp: 16   Alert pleasant oriented no further issues on monitor however subjectively has had episodes where she feels tachycardic General: EOMI, NCAT Cardiovascular: S1-S2 no murmur rub or gallop Respiratory: Clinically clear  Discharge Instructions  Discharge Orders   Future Orders Complete By Expires   Diet - low sodium heart healthy  As directed    Discharge instructions  As directed    Comments:     As had been discussed, you coulod very well have an irregualr Heart rate Please see Dr. Larey Brickowherd at the earliest 2/25 Wenesday for an event monitor Please continue your regular meds  Good luck and take care   Increase activity slowly  As directed        Medication List         amLODipine 5 MG tablet   Commonly known as:  NORVASC  Take 0.5 tablets (2.5 mg total) by mouth 2 (two) times daily.     atenolol 12.5 mg Tabs tablet  Commonly known as:  TENORMIN  Take 0.5 tablets (12.5 mg total) by mouth at bedtime.     busPIRone 5 MG tablet  Commonly known as:  BUSPAR  Take 1 tablet (5 mg total) by mouth 2 (two) times daily.     clopidogrel 75 MG tablet  Commonly known as:  PLAVIX  Take 1 tablet (75 mg total) by mouth at bedtime.     loratadine 10 MG tablet  Commonly known as:  CLARITIN  Take 10 mg by mouth daily as needed. For allergies     nitroGLYCERIN 0.4 MG SL tablet  Commonly known as:  NITROSTAT  Place 0.4 mg under the tongue every 5 (five) minutes as needed. For chest pain     pravastatin 80 MG tablet  Commonly known as:  PRAVACHOL  Take 1 tablet (80 mg total) by mouth at bedtime.     Vitamin D3 2000 UNITS capsule  Take 2,000 Units by mouth at bedtime.       No Known Allergies    The results of significant diagnostics from this hospitalization (including imaging, microbiology, ancillary and laboratory) are listed below for reference.    Significant Diagnostic Studies: Dg Chest Port 1 View  04/22/2013   CLINICAL DATA:  Shortness of breath.  EXAM: PORTABLE CHEST - 1 VIEW  COMPARISON:  January 12, 2012.  FINDINGS: The heart size and mediastinal contours are within normal limits. Both lungs are clear. No pleural effusion or pneumothorax is noted. Postsurgical changes are seen involving the right shoulder. Degenerative changes seen involving the left glenohumeral joint.  IMPRESSION: No acute cardiopulmonary abnormality seen.   Electronically Signed   By: Roque Lias M.D.   On: 04/22/2013 19:27    Microbiology: No results found for this or any previous visit (from the past 240 hour(s)).   Labs: Basic Metabolic Panel:  Recent Labs Lab 04/22/13 1847  NA 135*  K 3.9  CL 97  CO2 24  GLUCOSE 91  BUN 15  CREATININE 0.68  CALCIUM 9.5   Liver Function Tests: No  results found for this basename: AST, ALT, ALKPHOS, BILITOT, PROT, ALBUMIN,  in the last 168 hours No results found for this basename: LIPASE, AMYLASE,  in the last 168 hours No results found for this basename: AMMONIA,  in the last 168 hours CBC:  Recent Labs Lab 04/22/13 1847  WBC 4.3  NEUTROABS 2.6  HGB 13.0  HCT 38.7  MCV 88.4  PLT 240   Cardiac Enzymes:  Recent Labs Lab 04/22/13 1847  TROPONINI <0.30   BNP: BNP (last 3 results) No results found for this basename: PROBNP,  in the last 8760 hours CBG:  Recent Labs Lab 04/24/13 0736 04/24/13 1147 04/24/13 1706 04/24/13 2158 04/25/13 0749  GLUCAP 105* 107* 100* 108* 119*       Signed:  Rhetta Mura  [facility] 04/25/2013, 9:33 AM

## 2019-09-08 ENCOUNTER — Observation Stay (HOSPITAL_COMMUNITY): Payer: Medicare (Managed Care)

## 2019-09-08 ENCOUNTER — Emergency Department (HOSPITAL_COMMUNITY): Payer: Medicare (Managed Care)

## 2019-09-08 ENCOUNTER — Inpatient Hospital Stay (HOSPITAL_COMMUNITY)
Admission: EM | Admit: 2019-09-08 | Discharge: 2019-09-13 | DRG: 643 | Disposition: A | Discharge status: Home Health | Payer: Medicare (Managed Care) | Attending: Family Medicine | Admitting: Family Medicine

## 2019-09-08 ENCOUNTER — Other Ambulatory Visit: Payer: Self-pay

## 2019-09-08 ENCOUNTER — Encounter (HOSPITAL_COMMUNITY): Payer: Self-pay | Admitting: Emergency Medicine

## 2019-09-08 DIAGNOSIS — R42 Dizziness and giddiness: Secondary | ICD-10-CM | POA: Diagnosis not present

## 2019-09-08 DIAGNOSIS — E871 Hypo-osmolality and hyponatremia: Secondary | ICD-10-CM | POA: Diagnosis present

## 2019-09-08 DIAGNOSIS — Z8673 Personal history of transient ischemic attack (TIA), and cerebral infarction without residual deficits: Secondary | ICD-10-CM

## 2019-09-08 DIAGNOSIS — F0390 Unspecified dementia without behavioral disturbance: Secondary | ICD-10-CM | POA: Diagnosis present

## 2019-09-08 DIAGNOSIS — Z853 Personal history of malignant neoplasm of breast: Secondary | ICD-10-CM

## 2019-09-08 DIAGNOSIS — J45901 Unspecified asthma with (acute) exacerbation: Secondary | ICD-10-CM

## 2019-09-08 DIAGNOSIS — Z87891 Personal history of nicotine dependence: Secondary | ICD-10-CM

## 2019-09-08 DIAGNOSIS — E222 Syndrome of inappropriate secretion of antidiuretic hormone: Secondary | ICD-10-CM | POA: Diagnosis not present

## 2019-09-08 DIAGNOSIS — I10 Essential (primary) hypertension: Secondary | ICD-10-CM

## 2019-09-08 DIAGNOSIS — Z8679 Personal history of other diseases of the circulatory system: Secondary | ICD-10-CM

## 2019-09-08 DIAGNOSIS — R5383 Other fatigue: Secondary | ICD-10-CM | POA: Diagnosis not present

## 2019-09-08 DIAGNOSIS — Z85820 Personal history of malignant melanoma of skin: Secondary | ICD-10-CM

## 2019-09-08 DIAGNOSIS — Z20822 Contact with and (suspected) exposure to covid-19: Secondary | ICD-10-CM | POA: Diagnosis present

## 2019-09-08 DIAGNOSIS — K219 Gastro-esophageal reflux disease without esophagitis: Secondary | ICD-10-CM | POA: Diagnosis present

## 2019-09-08 DIAGNOSIS — F39 Unspecified mood [affective] disorder: Secondary | ICD-10-CM | POA: Diagnosis present

## 2019-09-08 DIAGNOSIS — R55 Syncope and collapse: Secondary | ICD-10-CM | POA: Diagnosis present

## 2019-09-08 DIAGNOSIS — Z171 Estrogen receptor negative status [ER-]: Secondary | ICD-10-CM

## 2019-09-08 DIAGNOSIS — Z7902 Long term (current) use of antithrombotics/antiplatelets: Secondary | ICD-10-CM

## 2019-09-08 DIAGNOSIS — Z79899 Other long term (current) drug therapy: Secondary | ICD-10-CM

## 2019-09-08 DIAGNOSIS — Z66 Do not resuscitate: Secondary | ICD-10-CM | POA: Diagnosis present

## 2019-09-08 DIAGNOSIS — Z9221 Personal history of antineoplastic chemotherapy: Secondary | ICD-10-CM

## 2019-09-08 DIAGNOSIS — G9341 Metabolic encephalopathy: Secondary | ICD-10-CM | POA: Diagnosis present

## 2019-09-08 DIAGNOSIS — T43225A Adverse effect of selective serotonin reuptake inhibitors, initial encounter: Secondary | ICD-10-CM | POA: Diagnosis present

## 2019-09-08 DIAGNOSIS — I4510 Unspecified right bundle-branch block: Secondary | ICD-10-CM | POA: Diagnosis present

## 2019-09-08 DIAGNOSIS — E785 Hyperlipidemia, unspecified: Secondary | ICD-10-CM | POA: Diagnosis present

## 2019-09-08 DIAGNOSIS — F418 Other specified anxiety disorders: Secondary | ICD-10-CM | POA: Diagnosis present

## 2019-09-08 DIAGNOSIS — Z9011 Acquired absence of right breast and nipple: Secondary | ICD-10-CM

## 2019-09-08 LAB — URINALYSIS, ROUTINE W REFLEX MICROSCOPIC
Bilirubin Urine: NEGATIVE
Glucose, UA: 50 mg/dL — AB
Hgb urine dipstick: NEGATIVE
Ketones, ur: NEGATIVE mg/dL
Nitrite: NEGATIVE
Protein, ur: 30 mg/dL — AB
Specific Gravity, Urine: 1.011 (ref 1.005–1.030)
pH: 7 (ref 5.0–8.0)

## 2019-09-08 LAB — BASIC METABOLIC PANEL WITH GFR
Anion gap: 8 (ref 5–15)
Anion gap: 9 (ref 5–15)
BUN: 10 mg/dL (ref 8–23)
BUN: 13 mg/dL (ref 8–23)
CO2: 23 mmol/L (ref 22–32)
CO2: 22 mmol/L (ref 22–32)
Calcium: 8.1 mg/dL — ABNORMAL LOW (ref 8.9–10.3)
Calcium: 8.2 mg/dL — ABNORMAL LOW (ref 8.9–10.3)
Chloride: 86 mmol/L — ABNORMAL LOW (ref 98–111)
Chloride: 86 mmol/L — ABNORMAL LOW (ref 98–111)
Creatinine, Ser: 0.7 mg/dL (ref 0.44–1.00)
Creatinine, Ser: 0.66 mg/dL (ref 0.44–1.00)
GFR calc Af Amer: >60 mL/min
GFR calc Af Amer: >60 mL/min
GFR calc non Af Amer: >60 mL/min
GFR calc non Af Amer: >60 mL/min
Glucose, Bld: 125 mg/dL — ABNORMAL HIGH (ref 70–99)
Glucose, Bld: 112 mg/dL — ABNORMAL HIGH (ref 70–99)
Potassium: 3.9 mmol/L (ref 3.5–5.1)
Potassium: 4.7 mmol/L (ref 3.5–5.1)
Sodium: 117 mmol/L — CL (ref 135–145)
Sodium: 117 mmol/L — CL (ref 135–145)

## 2019-09-08 LAB — BRAIN NATRIURETIC PEPTIDE: B Natriuretic Peptide: 40.2 pg/mL (ref 0.0–100.0)

## 2019-09-08 LAB — CBC
HCT: 40.8 % (ref 36.0–46.0)
Hemoglobin: 14.6 g/dL (ref 12.0–15.0)
MCH: 31.1 pg (ref 26.0–34.0)
MCHC: 35.8 g/dL (ref 30.0–36.0)
MCV: 87 fL (ref 80.0–100.0)
Platelets: 320 10*3/uL (ref 150–400)
RBC: 4.69 MIL/uL (ref 3.87–5.11)
RDW: 12.5 % (ref 11.5–15.5)
WBC: 5.7 10*3/uL (ref 4.0–10.5)
nRBC: 0 % (ref 0.0–0.2)

## 2019-09-08 LAB — TROPONIN I (HIGH SENSITIVITY)
Troponin I (High Sensitivity): 7 ng/L (ref <18)
Troponin I (High Sensitivity): 8 ng/L (ref <18)

## 2019-09-08 LAB — BASIC METABOLIC PANEL
Anion gap: 12 (ref 5–15)
Anion gap: 11 (ref 5–15)
BUN: 7 mg/dL — ABNORMAL LOW (ref 8–23)
BUN: 6 mg/dL — ABNORMAL LOW (ref 8–23)
CO2: 22 mmol/L (ref 22–32)
CO2: 21 mmol/L — ABNORMAL LOW (ref 22–32)
Calcium: 8.4 mg/dL — ABNORMAL LOW (ref 8.9–10.3)
Calcium: 8.1 mg/dL — ABNORMAL LOW (ref 8.9–10.3)
Chloride: 83 mmol/L — ABNORMAL LOW (ref 98–111)
Chloride: 84 mmol/L — ABNORMAL LOW (ref 98–111)
Creatinine, Ser: 0.59 mg/dL (ref 0.44–1.00)
Creatinine, Ser: 0.55 mg/dL (ref 0.44–1.00)
GFR calc Af Amer: >60 mL/min (ref >60)
GFR calc Af Amer: >60 mL/min (ref >60)
GFR calc non Af Amer: >60 mL/min (ref >60)
GFR calc non Af Amer: >60 mL/min (ref >60)
Glucose, Bld: 160 mg/dL — ABNORMAL HIGH (ref 70–99)
Glucose, Bld: 132 mg/dL — ABNORMAL HIGH (ref 70–99)
Potassium: 4.3 mmol/L (ref 3.5–5.1)
Potassium: 4 mmol/L (ref 3.5–5.1)
Sodium: 117 mmol/L — CL (ref 135–145)
Sodium: 116 mmol/L — CL (ref 135–145)

## 2019-09-08 LAB — HEPATIC FUNCTION PANEL
ALT: 18 U/L (ref 0–44)
AST: 19 U/L (ref 15–41)
Albumin: 4.1 g/dL (ref 3.5–5.0)
Alkaline Phosphatase: 65 U/L (ref 38–126)
Bilirubin, Direct: <0.1 mg/dL (ref 0.0–0.2)
Total Bilirubin: 0.7 mg/dL (ref 0.3–1.2)
Total Protein: 7.8 g/dL (ref 6.5–8.1)

## 2019-09-08 LAB — TSH: TSH: 4.381 u[IU]/mL (ref 0.350–4.500)

## 2019-09-08 LAB — CORTISOL: Cortisol, Plasma: 15.9 ug/dL

## 2019-09-08 LAB — OSMOLALITY, URINE: Osmolality, Ur: 432 mOsm/kg (ref 300–900)

## 2019-09-08 LAB — OSMOLALITY: Osmolality: 246 mOsm/kg — CL (ref 275–295)

## 2019-09-08 LAB — SARS CORONAVIRUS 2 BY RT PCR (HOSPITAL ORDER, PERFORMED IN ~~LOC~~ HOSPITAL LAB): SARS Coronavirus 2: NEGATIVE

## 2019-09-08 LAB — SODIUM, URINE, RANDOM: Sodium, Ur: 127 mmol/L

## 2019-09-08 MED ORDER — SODIUM CHLORIDE 1 G PO TABS
1.0000 g | ORAL_TABLET | Freq: Four times a day (QID) | ORAL | Status: DC
  Administered 2019-09-08: 1 g via ORAL
  Filled 2019-09-08: qty 1

## 2019-09-08 MED ORDER — PANTOPRAZOLE SODIUM 40 MG PO TBEC
40.0000 mg | DELAYED_RELEASE_TABLET | Freq: Two times a day (BID) | ORAL | Status: DC
  Administered 2019-09-08 – 2019-09-13 (×11): 40 mg via ORAL
  Filled 2019-09-08 (×11): qty 1

## 2019-09-08 MED ORDER — ALPRAZOLAM 0.25 MG PO TABS
0.1250 mg | ORAL_TABLET | Freq: Every day | ORAL | Status: DC | PRN
  Administered 2019-09-08 – 2019-09-10 (×2): 0.125 mg via ORAL
  Filled 2019-09-08 (×2): qty 1

## 2019-09-08 MED ORDER — ENOXAPARIN SODIUM 40 MG/0.4ML ~~LOC~~ SOLN
40.0000 mg | SUBCUTANEOUS | Status: DC
  Administered 2019-09-08 – 2019-09-12 (×5): 40 mg via SUBCUTANEOUS
  Filled 2019-09-08 (×5): qty 0.4

## 2019-09-08 MED ORDER — DILTIAZEM HCL 30 MG PO TABS
30.0000 mg | ORAL_TABLET | Freq: Four times a day (QID) | ORAL | Status: DC
  Administered 2019-09-08 – 2019-09-13 (×18): 30 mg via ORAL
  Filled 2019-09-08 (×20): qty 1

## 2019-09-08 MED ORDER — NITROGLYCERIN 0.4 MG SL SUBL: 0.4000 mg | SUBLINGUAL_TABLET | SUBLINGUAL | Status: DC | PRN

## 2019-09-08 MED ORDER — CLOPIDOGREL BISULFATE 75 MG PO TABS
75.0000 mg | ORAL_TABLET | Freq: Every day | ORAL | Status: DC
  Administered 2019-09-08 – 2019-09-12 (×5): 75 mg via ORAL
  Filled 2019-09-08 (×5): qty 1

## 2019-09-08 MED ORDER — SODIUM CHLORIDE 0.9% FLUSH: 3.0000 mL | Freq: Once | INTRAVENOUS | Status: DC

## 2019-09-08 MED ORDER — LOSARTAN POTASSIUM 50 MG PO TABS
50.0000 mg | ORAL_TABLET | Freq: Every day | ORAL | Status: DC
  Administered 2019-09-08 – 2019-09-09 (×2): 50 mg via ORAL
  Filled 2019-09-08 (×2): qty 1

## 2019-09-08 MED ORDER — SODIUM CHLORIDE 1 G PO TABS
1.0000 g | ORAL_TABLET | Freq: Once | ORAL | Status: AC
  Administered 2019-09-08: 1 g via ORAL
  Filled 2019-09-08: qty 1

## 2019-09-08 MED ORDER — SODIUM CHLORIDE 0.9 % IV BOLUS
500.0000 mL | Freq: Once | INTRAVENOUS | Status: AC
  Administered 2019-09-08: 500 mL via INTRAVENOUS

## 2019-09-08 MED ORDER — ACETAMINOPHEN 650 MG RE SUPP: 650.0000 mg | Freq: Four times a day (QID) | RECTAL | Status: DC | PRN

## 2019-09-08 MED ORDER — ACETAMINOPHEN 325 MG PO TABS
650.0000 mg | ORAL_TABLET | Freq: Four times a day (QID) | ORAL | Status: DC | PRN
  Administered 2019-09-11: 650 mg via ORAL
  Filled 2019-09-08: qty 2

## 2019-09-08 MED ORDER — ONDANSETRON HCL 4 MG/2ML IJ SOLN
4.0000 mg | Freq: Four times a day (QID) | INTRAMUSCULAR | Status: DC | PRN
  Administered 2019-09-08: 4 mg via INTRAVENOUS
  Filled 2019-09-08: qty 2

## 2019-09-08 MED ORDER — ONDANSETRON HCL 4 MG PO TABS: 4.0000 mg | ORAL_TABLET | Freq: Four times a day (QID) | ORAL | Status: DC | PRN

## 2019-09-08 MED ORDER — VITAMIN D 25 MCG (1000 UNIT) PO TABS
2000.0000 [IU] | ORAL_TABLET | Freq: Every day | ORAL | Status: DC
  Administered 2019-09-08 – 2019-09-13 (×6): 2000 [IU] via ORAL
  Filled 2019-09-08 (×6): qty 2

## 2019-09-08 NOTE — ED Provider Notes (Addendum)
[facility]-EMERGENCY DEPT Provider Note   CSN: [id number] Arrival date & time: 09/08/19  4888     History Chief Complaint  Patient presents with  . Irregular Heart Beat  . Hypertension    Jeanette Arroyo is a 86 y.o. female.  The history is provided by the patient and a relative. History limited by: pt has difficulty giving history, daughter giving most of the history.  Illness Quality:  Irregular heart beat, intermittent fatigue, flushness and SOB Severity:  Moderate Onset quality:  Gradual Timing:  Intermittent Progression:  Waxing and waning Chronicity:  New Context:  Did decrease HTN meds recently, noticed elevated BP from time to time, BP machine commenting on Irregular heart beat Relieved by:  Nothing Worsened by:  Nothing Ineffective treatments:  PRN HTN meds, dilt 30mg  PO Associated symptoms: fatigue, nausea (chronic with hx of reflux), shortness of breath and wheezing (chronic, no change)   Associated symptoms: no chest pain, no congestion, no cough, no diarrhea, no fever, no headaches, no rash, no rhinorrhea and no vomiting        Past Medical History:  Diagnosis Date  . Arthritis   . Asthma   . Right bundle branch block   . TIA (transient ischemic attack)     Patient Active Problem List   Diagnosis Date Noted  . Near syncope 04/22/2013  . HTN (hypertension) 04/22/2013  . HLD (hyperlipidemia) 04/22/2013  . Palpitations 04/22/2013    Past Surgical History:  Procedure Laterality Date  . CHOLECYSTECTOMY    . DILATION AND CURETTAGE OF UTERUS    . TONSILLECTOMY       OB History   No obstetric history on file.     Family History  Problem Relation Age of Onset  . CAD Mother     Social History   Tobacco Use  . Smoking status: Former Smoker  Substance Use Topics  . Alcohol use: Yes  . Drug use: No    Home Medications Prior to Admission medications   Medication Sig Start Date End Date Taking? Authorizing Provider    amLODipine (NORVASC) 5 MG tablet Take 0.5 tablets (2.5 mg total) by mouth 2 (two) times daily. 04/25/13   04/27/13, MD  atenolol (TENORMIN) 12.5 mg TABS tablet Take 0.5 tablets (12.5 mg total) by mouth at bedtime. 04/25/13   04/27/13, MD  busPIRone (BUSPAR) 5 MG tablet Take 1 tablet (5 mg total) by mouth 2 (two) times daily. 04/25/13   04/27/13, MD  Cholecalciferol (VITAMIN D3) 2000 UNITS capsule Take 2,000 Units by mouth at bedtime.    [provider]  clopidogrel (PLAVIX) 75 MG tablet Take 1 tablet (75 mg total) by mouth at bedtime. 04/25/13   04/27/13, MD  loratadine (CLARITIN) 10 MG tablet Take 10 mg by mouth daily as needed. For allergies    [provider]  nitroGLYCERIN (NITROSTAT) 0.4 MG SL tablet Place 1 tablet (0.4 mg total) under the tongue every 5 (five) minutes as needed for chest pain. 04/25/13   04/27/13, MD  pravastatin (PRAVACHOL) 80 MG tablet Take 1 tablet (80 mg total) by mouth at bedtime. 04/25/13   04/27/13, MD    Allergies    Patient has no known allergies.  Review of Systems   Review of Systems  Constitutional: Positive for fatigue. Negative for chills, fever and unexpected weight change.  HENT: Negative for congestion and rhinorrhea.   Respiratory: Positive for shortness of breath and wheezing (chronic, no change). Negative  for cough and chest tightness.   Cardiovascular: Positive for palpitations (reported by family). Negative for chest pain and leg swelling.  Gastrointestinal: Positive for nausea (chronic with hx of reflux). Negative for diarrhea and vomiting.  Genitourinary: Negative for difficulty urinating and dysuria.  Musculoskeletal: Negative for back pain.  Skin: Negative for color change, pallor, rash and wound.  Neurological: Positive for dizziness and light-headedness. Negative for syncope, facial asymmetry, speech difficulty, numbness and headaches.   Psychiatric/Behavioral: Negative for behavioral problems and confusion.    Physical Exam Updated Vital Signs BP (!) 155/90   Pulse 68   Temp 97.7 F (36.5 C) (Oral)   Resp 19   SpO2 99%   Physical Exam Vitals and nursing note reviewed. Exam conducted with a chaperone present.  Constitutional:      General: She is not in acute distress.    Appearance: Normal appearance.  HENT:     Head: Normocephalic and atraumatic.     Nose: No rhinorrhea.  Eyes:     General:        Right eye: No discharge.        Left eye: No discharge.     Conjunctiva/sclera: Conjunctivae normal.  Cardiovascular:     Rate and Rhythm: Normal rate. Rhythm irregular.  Pulmonary:     Effort: Pulmonary effort is normal. No respiratory distress.     Breath sounds: No stridor. No wheezing or rhonchi.  Chest:     Chest wall: No tenderness.  Abdominal:     General: Abdomen is flat. There is no distension.     Palpations: Abdomen is soft.     Tenderness: There is no abdominal tenderness.  Musculoskeletal:        General: No swelling, tenderness or signs of injury.     Left lower leg: No edema.  Skin:    General: Skin is warm and dry.  Neurological:     General: No focal deficit present.     Mental Status: She is alert. Mental status is at baseline.     Motor: No weakness.  Psychiatric:        Mood and Affect: Mood normal.        Behavior: Behavior normal.     ED Results / Procedures / Treatments   Labs (all labs ordered are listed, but only abnormal results are displayed) Labs Reviewed  BASIC METABOLIC PANEL - Abnormal; Notable for the following components:      Result Value   Sodium 117 (*)    Chloride 83 (*)    Glucose, Bld 160 (*)    BUN 7 (*)    Calcium 8.4 (*)    All other components within normal limits  URINALYSIS, ROUTINE W REFLEX MICROSCOPIC - Abnormal; Notable for the following components:   APPearance HAZY (*)    Glucose, UA 50 (*)    Protein, ur 30 (*)    Leukocytes,Ua TRACE  (*)    Bacteria, UA RARE (*)    All other components within normal limits  SARS CORONAVIRUS 2 BY RT PCR (HOSPITAL ORDER, PERFORMED IN [facility])  CBC  HEPATIC FUNCTION PANEL  BRAIN NATRIURETIC PEPTIDE  SODIUM, URINE, RANDOM  OSMOLALITY, URINE  OSMOLALITY  BASIC METABOLIC PANEL  BASIC METABOLIC PANEL  TSH  CORTISOL  TROPONIN I (HIGH SENSITIVITY)  TROPONIN I (HIGH SENSITIVITY)    EKG EKG Interpretation  Date/Time:  Wednesday September 08 2019 08:34:39 EDT Ventricular Rate:  78 PR Interval:    QRS Duration: 158  QT Interval:  413 QTC Calculation: 471 R Axis:   -21 Text Interpretation: Sinus arrhythmia Right bundle branch block 12 Lead; Mason-Likar Confirmed by Cherlynn Perches ([id number]) on 09/08/2019 9:02:00 AM   Radiology DG Chest 2 View  Result Date: 09/08/2019 CLINICAL DATA:  Irregular heart rate. EXAM: CHEST - 2 VIEW COMPARISON:  April 22, 2013. FINDINGS: Stable cardiomediastinal silhouette. No pneumothorax or pleural effusion is noted. Lungs are clear. Bony thorax is unremarkable. IMPRESSION: No active cardiopulmonary disease. Aortic Atherosclerosis (ICD10-I70.0). Electronically Signed   By: Lupita Raider M.D.   On: 09/08/2019 09:08    Procedures .Critical Care Performed by: Sabino Donovan, MD Authorized by: Sabino Donovan, MD   Critical care provider statement:    Critical care was necessary to treat or prevent imminent or life-threatening deterioration of the following conditions:  Metabolic crisis (hyponatremia)   Critical care was time spent personally by me on the following activities:  Blood draw for specimens, development of treatment plan with patient or surrogate, discussions with consultants, examination of patient, obtaining history from patient or surrogate, ordering and performing treatments and interventions, ordering and review of laboratory studies, ordering and review of radiographic studies, pulse oximetry, re-evaluation of patient's condition and review  of old charts   I assumed direction of critical care for this patient from another provider in my specialty: no     (including critical care time)  Medications Ordered in ED Medications  sodium chloride flush (NS) 0.9 % injection 3 mL (3 mLs Intravenous Not Given 09/08/19 0905)  sodium chloride 0.9 % bolus 500 mL (500 mLs Intravenous New Bag/Given (Non-Interop) 09/08/19 1019)    ED Course  I have reviewed the triage vital signs and the nursing notes.  Pertinent labs & imaging results that were available during my care of the patient were reviewed by me and considered in my medical decision making (see chart for details).    MDM Rules/Calculators/A&P                          86 year old female comes with intermittent episodes of irregular heartbeat, elevated blood pressure based on home monitor and family concerns for needing potential antihypertensive medication change.  Medications were recently decreased due to lightheadedness they thought it could have been medication effect and they have noticed since then the blood pressure is gone up.  They have as needed antihypertensives that the been taking lately they have been intermittently helping.  Upon arrival here she is hemodynamically stable resting comfortably.  EKG interpreted by myself shows right bundle branch block with no acute ischemic change interval abnormality or arrhythmia.  Her mental status is at her baseline per family, she has normal neurologic exam, she has a mildly irregular heartbeat however no other abnormal heart sounds lung sounds are clear.  She is overall well-appearing.  She will need laboratory screening for kidney heart liver and electrolyte dysfunction.  She will get a chest x-ray as well.  Concerns for hypertensive urgency versus emergency.  No infectious signs or symptoms otherwise.  She has been eating and drinking normally.  The family brought up that they were concerned about possible urinary tract infection as the  urine has been foul-smelling urine is sent.  Labs are interpreted by me as they returned and shows a troponin within normal limits, BNP within normal limits.  Chemistry concerning for hyponatremia and hypochloremia.  Creatinine within normal limits as well.  Patient claims to be making  normal urine.  Chest x-ray interpreted by radiology and reviewed by myself also shows no acute cardiopulmonary pathology.  Likely chronic atherosclerotic disease of the aorta, no concerns or signs of dissection or other emergent pathology at this time.  The patient's intermittent dizziness and symptoms, hyponatremia could be the cause.  Further testing and management will be needed.  I will give a 500 cc bolus of normal saline to begin the correction and the hospitalist will be consulted for evaluation and admission.  CRITICAL CARE Performed by: Sabino DonovanEric C Irania Durell   Total critical care time: 35 minutes  Critical care time was exclusive of separately billable procedures and treating other patients.  Critical care was necessary to treat or prevent imminent or life-threatening deterioration.  Critical care was time spent personally by me on the following activities: development of treatment plan with patient and/or surrogate as well as nursing, discussions with consultants, evaluation of patient's response to treatment, examination of patient, obtaining history from patient or surrogate, ordering and performing treatments and interventions, ordering and review of laboratory studies, ordering and review of radiographic studies, pulse oximetry and re-evaluation of patient's condition.   The patient will be admitted to the hospitalist.  For the remainder this patient's care please see inpatient team notes.  I will intervene as needed while the patient remains in the emergency department.   Final Clinical Impression(s) / ED Diagnoses Final diagnoses:  Hyponatremia    Rx / DC Orders ED Discharge Orders    None         Sabino DonovanKatz, Abhishek Levesque C, MD 09/08/19 1027    Sabino DonovanKatz, Tomeko Scoville C, MD 09/08/19 1033    Sabino DonovanKatz, Illana Nolting C, MD 09/08/19 [phone]

## 2019-09-08 NOTE — Progress Notes (Signed)
CRITICAL VALUE ALERT  Critical Value:  Serum Osmolarity 246  Date & Time Notied:  09/08/2019 1729  Provider Notified: Dr. Dierdre Searles  Orders Received/Actions taken:

## 2019-09-08 NOTE — Progress Notes (Signed)
CRITICAL VALUE ALERT  Critical Value:  Sodium 117  Date & Time Notied:  09/08/2019 1810  Provider Notified: Dr. Dierdre Searles  Orders Received/Actions taken:

## 2019-09-08 NOTE — Plan of Care (Signed)

## 2019-09-08 NOTE — ED Triage Notes (Signed)
Pt c/o irregular heart rate and HTN like 196/108 for the past couple days. Pt took Diltiazem and her BP medications. Pt woke up this morning feeling dizzy and faint.  Pt visiting from out of town.  Pt started Lexapro 3 weeks ago.

## 2019-09-08 NOTE — H&P (Signed)
History and Physical    Jeanette Arroyo OFB:[id number] DOB: [birth year] DOA: 09/08/2019  PCP: System, Provider Not In Patient coming from: home  Chief Complaint: dizziness and fatigue  HPI: Jeanette Arroyo is a 86 y.o. female with medical history significant of TIA, chronic right BBB, asthma, breast cancer s/p completion of chemotherapy in 2020 who presented with dizziness and fatigue.  Patient is a poor historian and H&P was obtained by chart review and talking to patient and her daughter at bedside  Patient usually lives with her son at [place], [place], and she recently moves in with her daughter a few days ago.   Patient reports that she has had intermittent lightheadedness, dizziness and fatigue for a few days.  Associated symptom included mild nausea, "acid reflux" symptoms and generalized weakness.  She apparently has had intermittently high and low blood pressures for a few weeks. Her home diltiazem 300 mg was changed to 30 mg as needed, and losartan was added to her regimen by her PCP.  She continued to have elevated SBP of 190's at home and noted to have " irregular rhythm" on home BP monitor. Per daughter, [name] was added for anxiety about 3 weeks ago, and Protonix was added about 1 week ago.  This morning patient woke up feeling dizziness, lightheadedness with one near syncope episode.  Denies syncope , loss of consciousness, headaches, blurry vision, focal numbness/weakness, seizures or confusion.  Patient came to the emergency room for further evaluation.  In the emergency room, she was afebrile with pulse 89, RR 19, BP 159/83 and room air O2 sats 99%.  The labs showed sodium 117, chloride 83, creatinine 0.59, negative troponin, negative proBNP, nonrevealing CBC, negative UA.  EKG showed NSR with RBBB. CXR showed no acute changes.  Patient received saline 500 cc bolus at the ED.  Denies fevers, chills, shortness of breath, cough, wheezing, chest pain/pressure, palpitations,  vomiting, diarrhea, abdominal pain, dysuria, urinary frequency or urgency  Review of Systems: As per HPI otherwise 10 point review of systems negative.  Review of Systems Otherwise negative except as per HPI, including: General: Denies fever, chills, night sweats or unintended weight loss. Resp: Denies cough, wheezing, shortness of breath. Cardiac: Denies chest pain, palpitations, orthopnea, paroxysmal nocturnal dyspnea. GI: Denies abdominal pain,vomiting, diarrhea or constipation.  Positive for mild nausea GU: Denies dysuria, frequency, hesitancy or incontinence MS: Denies muscle aches, joint pain or swelling Neuro: Denies headache, neurologic deficits (focal weakness, numbness, tingling), abnormal gait.  Positive for dizziness, lightheadedness, fatigue and generalized weakness.  Positive for near syncope. Psych: Denies anxiety, depression, SI/HI/AVH Skin: Denies new rashes or lesions ID: Denies sick contacts, exotic exposures, travel  Past Medical History:  Diagnosis Date   Arthritis    Asthma    Right bundle branch block    TIA (transient ischemic attack)     Past Surgical History:  Procedure Laterality Date   CHOLECYSTECTOMY     DILATION AND CURETTAGE OF UTERUS     TONSILLECTOMY      SOCIAL HISTORY:  reports that she has quit smoking. She has never used smokeless tobacco. She reports previous alcohol use. She reports that she does not use drugs.  No Known Allergies  FAMILY HISTORY: Family History  Problem Relation Age of Onset   CAD Mother      Prior to Admission medications   Medication Sig Start Date End Date Taking? Authorizing Provider  amLODipine (NORVASC) 5 MG tablet Take 0.5 tablets (2.5 mg total) by mouth 2 (two)  times daily. 04/25/13   Rhetta MuraSamtani, Jai-Gurmukh, MD  atenolol (TENORMIN) 12.5 mg TABS tablet Take 0.5 tablets (12.5 mg total) by mouth at bedtime. 04/25/13   Rhetta MuraSamtani, Jai-Gurmukh, MD  busPIRone (BUSPAR) 5 MG tablet Take 1 tablet (5 mg total) by  mouth 2 (two) times daily. 04/25/13   Rhetta MuraSamtani, Jai-Gurmukh, MD  Cholecalciferol (VITAMIN D3) 2000 UNITS capsule Take 2,000 Units by mouth at bedtime.    [provider]  clopidogrel (PLAVIX) 75 MG tablet Take 1 tablet (75 mg total) by mouth at bedtime. 04/25/13   Rhetta MuraSamtani, Jai-Gurmukh, MD  loratadine (CLARITIN) 10 MG tablet Take 10 mg by mouth daily as needed. For allergies    [provider]  nitroGLYCERIN (NITROSTAT) 0.4 MG SL tablet Place 1 tablet (0.4 mg total) under the tongue every 5 (five) minutes as needed for chest pain. 04/25/13   Rhetta MuraSamtani, Jai-Gurmukh, MD  pravastatin (PRAVACHOL) 80 MG tablet Take 1 tablet (80 mg total) by mouth at bedtime. 04/25/13   Rhetta MuraSamtani, Jai-Gurmukh, MD    Physical Exam: Vitals:   09/08/19 0829 09/08/19 0915 09/08/19 1110  BP: (!) 159/83 (!) 155/90 (!) 164/83  Pulse: 89 68 70  Resp: 19 19 18   Temp: 97.7 F (36.5 C)    TempSrc: Oral    SpO2: 98% 99% 98%  Weight:   66.7 kg  Height:   5\' 1"  (1.549 m)      Constitutional: NAD, calm, comfortable.  Acute ill-appearing. Eyes: PERRL, lids and conjunctivae normal ENMT: Mucous membranes are moist. Posterior pharynx clear of any exudate or lesions.Normal dentition.  Neck: normal, supple, no masses, no thyromegaly Respiratory: clear to auscultation bilaterally, no wheezing, no crackles. Normal respiratory effort. No accessory muscle use.  Cardiovascular: Regular rate and rhythm, no murmurs / rubs / gallops. No extremity edema. 2+ pedal pulses. No carotid bruits.  Abdomen: no tenderness, no masses palpated. No hepatosplenomegaly. Bowel sounds positive.  Musculoskeletal: no clubbing / cyanosis. No joint deformity upper and lower extremities. Good ROM, no contractures. Normal muscle tone.  Skin: no rashes, lesions, ulcers. No induration Neurologic: CN 2-12 grossly intact. Sensation intact, DTR normal. Strength 5/5 in all 4.  Psychiatric: Normal judgment and insight. Alert and oriented x 3. Normal mood.      Labs on Admission: I have personally reviewed following labs and imaging studies  CBC: Recent Labs  Lab 09/08/19 0833  WBC 5.7  HGB 14.6  HCT 40.8  MCV 87.0  PLT 320   Basic Metabolic Panel: Recent Labs  Lab 09/08/19 0833  Lorma Heater 117*  K 4.3  CL 83*  CO2 22  GLUCOSE 160*  BUN 7*  CREATININE 0.59  CALCIUM 8.4*   GFR: Estimated Creatinine Clearance: 36 mL/min (by C-G formula based on SCr of 0.59 mg/dL). Liver Function Tests: Recent Labs  Lab 09/08/19 0833  AST 19  ALT 18  ALKPHOS 65  BILITOT 0.7  PROT 7.8  ALBUMIN 4.1   No results for input(s): LIPASE, AMYLASE in the last 168 hours. No results for input(s): AMMONIA in the last 168 hours. Coagulation Profile: No results for input(s): INR, PROTIME in the last 168 hours. Cardiac Enzymes: No results for input(s): CKTOTAL, CKMB, CKMBINDEX, TROPONINI in the last 168 hours. BNP (last 3 results) No results for input(s): PROBNP in the last 8760 hours. HbA1C: No results for input(s): HGBA1C in the last 72 hours. CBG: No results for input(s): GLUCAP in the last 168 hours. Lipid Profile: No results for input(s): CHOL, HDL, LDLCALC, TRIG, CHOLHDL, LDLDIRECT in  the last 72 hours. Thyroid Function Tests: No results for input(s): TSH, T4TOTAL, FREET4, T3FREE, THYROIDAB in the last 72 hours. Anemia Panel: No results for input(s): VITAMINB12, FOLATE, FERRITIN, TIBC, IRON, RETICCTPCT in the last 72 hours. Urine analysis:    Component Value Date/Time   COLORURINE YELLOW 09/08/2019 0950   APPEARANCEUR HAZY (A) 09/08/2019 0950   LABSPEC 1.011 09/08/2019 0950   PHURINE 7.0 09/08/2019 0950   GLUCOSEU 50 (A) 09/08/2019 0950   HGBUR NEGATIVE 09/08/2019 0950   BILIRUBINUR NEGATIVE 09/08/2019 0950   KETONESUR NEGATIVE 09/08/2019 0950   PROTEINUR 30 (A) 09/08/2019 0950   UROBILINOGEN 0.2 04/22/2013 2125   NITRITE NEGATIVE 09/08/2019 0950   LEUKOCYTESUR TRACE (A) 09/08/2019 0950   Sepsis Labs:  !!!!!!!!!!!!!!!!!!!!!!!!!!!!!!!!!!!!!!!!!!!! @LABRCNTIP (procalcitonin:4,lacticidven:4) )No results found for this or any previous visit (from the past 240 hour(s)).   Radiological Exams on Admission: DG Chest 2 View  Result Date: 09/08/2019 CLINICAL DATA:  Irregular heart rate. EXAM: CHEST - 2 VIEW COMPARISON:  April 22, 2013. FINDINGS: Stable cardiomediastinal silhouette. No pneumothorax or pleural effusion is noted. Lungs are clear. Bony thorax is unremarkable. IMPRESSION: No active cardiopulmonary disease. Aortic Atherosclerosis (ICD10-I70.0). Electronically Signed   By: April 24, 2013 M.D.   On: 09/08/2019 09:08     All images have been reviewed by me personally.  EKG: Independently reviewed.   Assessment/Plan Active Problems:   Near syncope   HTN (hypertension)   HLD (hyperlipidemia)   Hyponatremia   Dizziness   Uncontrolled hypertension   History of right bundle branch block (RBBB)   Asthma, chronic, unspecified asthma severity, with acute exacerbation   History of transient ischemic attack (TIA)   History of breast cancer   #Dizziness, lightheadedness and fatigue #Significant hyponatremia  Patient presented with dizziness, lightheadedness and fatigue for a few days, and found to have significant hyponatremia with a sodium 117 on admission.  She denies vomiting or diarrhea, there is no GI loss.  No history of CHF, liver cirrhosis or renal failure.  No volume overload on exam.  The etiology could be related to possible developing SIADH from recent initiation of Lexapro.   -Telemetry observation -Head CT is pending -Orthostatic vital signs pending -Serum osmolality, urine osmolality and urine sodium pending -Monitor BMP every 6 hours -Patient received NS bolus 500 cc at ED, so will hold further NS infusion pending FU BMP.  - The goal of correction rate is to raise the serum sodium concentration by 4 to 6 mEq/L in a 24-hour period  but no more than 8 meq correction for  next 24 hours - regular diet but NO free water - hold Lexapro  #Uncontrolled essential hypertension #Chronic right BBB  -Optimize blood pressure -Place her on scheduled Cardizem 30 mg every 6 hours instead of home as needed dose -Continue home losartan -Titrate above anti-HTN meds as needed   #History of asthma -chronic and at baseline  # Hx of TIA  -Chronic and at baseline  #History of breast cancer /p completion of chemotherapy in 2020   -Noted      Body mass index is 27.78 kg/m.        DVT prophylaxis: Lovenox Code Status: DNR/DNI Family Communication: Daughter at bedside Consults called: None needed Admission status: Observation telemetry  Status is: Observation  The patient remains OBS appropriate and will d/c before 2 midnights.  Dispo: The patient is from: Home              Anticipated d/c is to: Home  Anticipated d/c date is: 2 days              Patient currently is not medically stable to d/c.       Time Spent: 65 minutes.  >50% of the time was devoted to discussing the patients care, assessment, plan and disposition with other care givers along with counseling the patient about the risks and benefits of treatment.    Dede Query MD [facility]  If 7PM-7AM, please contact night-coverage   09/08/2019, 11:27 AM

## 2019-09-08 NOTE — ED Notes (Signed)
Date and time results received: 09/08/19 9:41 AM  (use smartphrase ".now" to insert current time)  Test: Na+ Critical Value: 117  Name of Provider Notified: Nurse Lorren  Orders Received? Or Actions Taken?:

## 2019-09-08 NOTE — ED Notes (Signed)
PT was walk assisted to restroom with assistance, PT maintained steady gait.

## 2019-09-09 DIAGNOSIS — Z66 Do not resuscitate: Secondary | ICD-10-CM | POA: Diagnosis present

## 2019-09-09 DIAGNOSIS — G9341 Metabolic encephalopathy: Secondary | ICD-10-CM | POA: Diagnosis present

## 2019-09-09 DIAGNOSIS — Z171 Estrogen receptor negative status [ER-]: Secondary | ICD-10-CM | POA: Diagnosis not present

## 2019-09-09 DIAGNOSIS — Z20822 Contact with and (suspected) exposure to covid-19: Secondary | ICD-10-CM | POA: Diagnosis present

## 2019-09-09 DIAGNOSIS — Z7902 Long term (current) use of antithrombotics/antiplatelets: Secondary | ICD-10-CM | POA: Diagnosis not present

## 2019-09-09 DIAGNOSIS — R42 Dizziness and giddiness: Secondary | ICD-10-CM

## 2019-09-09 DIAGNOSIS — Z87891 Personal history of nicotine dependence: Secondary | ICD-10-CM | POA: Diagnosis not present

## 2019-09-09 DIAGNOSIS — Z85820 Personal history of malignant melanoma of skin: Secondary | ICD-10-CM | POA: Diagnosis not present

## 2019-09-09 DIAGNOSIS — E222 Syndrome of inappropriate secretion of antidiuretic hormone: Secondary | ICD-10-CM | POA: Diagnosis present

## 2019-09-09 DIAGNOSIS — I4510 Unspecified right bundle-branch block: Secondary | ICD-10-CM | POA: Diagnosis present

## 2019-09-09 DIAGNOSIS — E871 Hypo-osmolality and hyponatremia: Secondary | ICD-10-CM

## 2019-09-09 DIAGNOSIS — J45901 Unspecified asthma with (acute) exacerbation: Secondary | ICD-10-CM

## 2019-09-09 DIAGNOSIS — K219 Gastro-esophageal reflux disease without esophagitis: Secondary | ICD-10-CM | POA: Diagnosis present

## 2019-09-09 DIAGNOSIS — I10 Essential (primary) hypertension: Secondary | ICD-10-CM

## 2019-09-09 DIAGNOSIS — Z9221 Personal history of antineoplastic chemotherapy: Secondary | ICD-10-CM | POA: Diagnosis not present

## 2019-09-09 DIAGNOSIS — R55 Syncope and collapse: Secondary | ICD-10-CM

## 2019-09-09 DIAGNOSIS — F39 Unspecified mood [affective] disorder: Secondary | ICD-10-CM | POA: Diagnosis present

## 2019-09-09 DIAGNOSIS — Z9011 Acquired absence of right breast and nipple: Secondary | ICD-10-CM | POA: Diagnosis not present

## 2019-09-09 DIAGNOSIS — T43225A Adverse effect of selective serotonin reuptake inhibitors, initial encounter: Secondary | ICD-10-CM | POA: Diagnosis present

## 2019-09-09 DIAGNOSIS — Z8673 Personal history of transient ischemic attack (TIA), and cerebral infarction without residual deficits: Secondary | ICD-10-CM

## 2019-09-09 DIAGNOSIS — E785 Hyperlipidemia, unspecified: Secondary | ICD-10-CM

## 2019-09-09 DIAGNOSIS — F0390 Unspecified dementia without behavioral disturbance: Secondary | ICD-10-CM | POA: Diagnosis present

## 2019-09-09 DIAGNOSIS — Z853 Personal history of malignant neoplasm of breast: Secondary | ICD-10-CM | POA: Diagnosis not present

## 2019-09-09 DIAGNOSIS — Z79899 Other long term (current) drug therapy: Secondary | ICD-10-CM | POA: Diagnosis not present

## 2019-09-09 DIAGNOSIS — R5383 Other fatigue: Secondary | ICD-10-CM | POA: Diagnosis present

## 2019-09-09 DIAGNOSIS — F418 Other specified anxiety disorders: Secondary | ICD-10-CM | POA: Diagnosis present

## 2019-09-09 DIAGNOSIS — Z8679 Personal history of other diseases of the circulatory system: Secondary | ICD-10-CM

## 2019-09-09 LAB — CBC WITH DIFFERENTIAL/PLATELET
Abs Immature Granulocytes: 0.02 10*3/uL (ref 0.00–0.07)
Basophils Absolute: 0 10*3/uL (ref 0.0–0.1)
Basophils Relative: 0 %
Eosinophils Absolute: 0 10*3/uL (ref 0.0–0.5)
Eosinophils Relative: 1 %
HCT: 34.3 % — ABNORMAL LOW (ref 36.0–46.0)
Hemoglobin: 12.1 g/dL (ref 12.0–15.0)
Immature Granulocytes: 0 %
Lymphocytes Relative: 11 %
Lymphs Abs: 0.6 10*3/uL — ABNORMAL LOW (ref 0.7–4.0)
MCH: 30.7 pg (ref 26.0–34.0)
MCHC: 35.3 g/dL (ref 30.0–36.0)
MCV: 87.1 fL (ref 80.0–100.0)
Monocytes Absolute: 0.6 10*3/uL (ref 0.1–1.0)
Monocytes Relative: 12 %
Neutro Abs: 3.7 10*3/uL (ref 1.7–7.7)
Neutrophils Relative %: 76 %
Platelets: 273 10*3/uL (ref 150–400)
RBC: 3.94 MIL/uL (ref 3.87–5.11)
RDW: 12.6 % (ref 11.5–15.5)
WBC: 5 10*3/uL (ref 4.0–10.5)
nRBC: 0 % (ref 0.0–0.2)

## 2019-09-09 LAB — OSMOLALITY: Osmolality: 248 mOsm/kg — CL (ref 275–295)

## 2019-09-09 LAB — BASIC METABOLIC PANEL
Anion gap: 9 (ref 5–15)
Anion gap: 12 (ref 5–15)
Anion gap: 9 (ref 5–15)
Anion gap: 9 (ref 5–15)
BUN: 10 mg/dL (ref 8–23)
BUN: 8 mg/dL (ref 8–23)
BUN: 11 mg/dL (ref 8–23)
BUN: 14 mg/dL (ref 8–23)
CO2: 21 mmol/L — ABNORMAL LOW (ref 22–32)
CO2: 20 mmol/L — ABNORMAL LOW (ref 22–32)
CO2: 23 mmol/L (ref 22–32)
CO2: 22 mmol/L (ref 22–32)
Calcium: 8.1 mg/dL — ABNORMAL LOW (ref 8.9–10.3)
Calcium: 8.2 mg/dL — ABNORMAL LOW (ref 8.9–10.3)
Calcium: 8 mg/dL — ABNORMAL LOW (ref 8.9–10.3)
Calcium: 8.1 mg/dL — ABNORMAL LOW (ref 8.9–10.3)
Chloride: 88 mmol/L — ABNORMAL LOW (ref 98–111)
Chloride: 89 mmol/L — ABNORMAL LOW (ref 98–111)
Chloride: 90 mmol/L — ABNORMAL LOW (ref 98–111)
Chloride: 90 mmol/L — ABNORMAL LOW (ref 98–111)
Creatinine, Ser: 0.53 mg/dL (ref 0.44–1.00)
Creatinine, Ser: 0.5 mg/dL (ref 0.44–1.00)
Creatinine, Ser: 0.56 mg/dL (ref 0.44–1.00)
Creatinine, Ser: 0.66 mg/dL (ref 0.44–1.00)
GFR calc Af Amer: >60 mL/min (ref >60)
GFR calc Af Amer: >60 mL/min (ref >60)
GFR calc Af Amer: >60 mL/min (ref >60)
GFR calc Af Amer: >60 mL/min (ref >60)
GFR calc non Af Amer: >60 mL/min (ref >60)
GFR calc non Af Amer: >60 mL/min (ref >60)
GFR calc non Af Amer: >60 mL/min (ref >60)
GFR calc non Af Amer: >60 mL/min (ref >60)
Glucose, Bld: 114 mg/dL — ABNORMAL HIGH (ref 70–99)
Glucose, Bld: 118 mg/dL — ABNORMAL HIGH (ref 70–99)
Glucose, Bld: 101 mg/dL — ABNORMAL HIGH (ref 70–99)
Glucose, Bld: 103 mg/dL — ABNORMAL HIGH (ref 70–99)
Potassium: 4.4 mmol/L (ref 3.5–5.1)
Potassium: 4.2 mmol/L (ref 3.5–5.1)
Potassium: 4.3 mmol/L (ref 3.5–5.1)
Potassium: 4.4 mmol/L (ref 3.5–5.1)
Sodium: 118 mmol/L — CL (ref 135–145)
Sodium: 121 mmol/L — ABNORMAL LOW (ref 135–145)
Sodium: 122 mmol/L — ABNORMAL LOW (ref 135–145)
Sodium: 121 mmol/L — ABNORMAL LOW (ref 135–145)

## 2019-09-09 LAB — SODIUM
Sodium: 121 mmol/L — ABNORMAL LOW (ref 135–145)
Sodium: 124 mmol/L — ABNORMAL LOW (ref 135–145)

## 2019-09-09 LAB — SODIUM, URINE, RANDOM: Sodium, Ur: 98 mmol/L

## 2019-09-09 LAB — HEMOGLOBIN A1C
Hgb A1c MFr Bld: 5.5 % (ref 4.8–5.6)
Mean Plasma Glucose: 111.15 mg/dL

## 2019-09-09 LAB — OSMOLALITY, URINE: Osmolality, Ur: 572 mOsm/kg (ref 300–900)

## 2019-09-09 LAB — MRSA PCR SCREENING: MRSA by PCR: NEGATIVE

## 2019-09-09 MED ORDER — CALCIUM GLUCONATE-NACL 1-0.675 GM/50ML-% IV SOLN
1.0000 g | Freq: Once | INTRAVENOUS | Status: AC
  Administered 2019-09-09: 1000 mg via INTRAVENOUS
  Filled 2019-09-09: qty 50

## 2019-09-09 MED ORDER — SODIUM CHLORIDE 1 G PO TABS
2.0000 g | ORAL_TABLET | Freq: Four times a day (QID) | ORAL | Status: DC
  Administered 2019-09-09: 2 g via ORAL
  Filled 2019-09-09: qty 2

## 2019-09-09 MED ORDER — CHLORHEXIDINE GLUCONATE CLOTH 2 % EX PADS
6.0000 | MEDICATED_PAD | Freq: Every day | CUTANEOUS | Status: DC
  Administered 2019-09-09 – 2019-09-11 (×3): 6 via TOPICAL

## 2019-09-09 MED ORDER — SODIUM CHLORIDE 3 % IV SOLN
INTRAVENOUS | Status: DC
  Filled 2019-09-09 (×4): qty 500

## 2019-09-09 MED ORDER — SODIUM CHLORIDE 1 G PO TABS
1.0000 g | ORAL_TABLET | Freq: Once | ORAL | Status: AC
  Administered 2019-09-09: 1 g via ORAL
  Filled 2019-09-09: qty 1

## 2019-09-09 NOTE — Consult Note (Addendum)
[facility] Nephrology Consultation Note  Requesting MD: Dr Hazeline Junker Reason for consult: Hyponatremia  HPI: Jeanette Arroyo is a 86 y.o. female with history of TIA, hypertension, right bundle branch block, asthma, osteoarthritis, acid reflux, breast cancer status post chemotherapy, anxiety depression recently started on Lexapro about 3 weeks ago admitted with dizziness, change in mental status and fatigue, seen as a consultation for the management of hyponatremia. Patient is visiting from out of city and now staying with her daughter for few days.  She was noted to be more confused, associated with a fatigue and complaining of generalized weakness.  For hypertension she has been on diltiazem, losartan with fluctuation in blood pressure.  Her PCP added Lexapro about 3 weeks ago for the management of anxiety depression.  She is not on diuretics.  Reports chronic nausea therefore undergoing evaluation with GI and had CT scan of abdomen done.  She is now on PPI for acid reflux.  No diarrhea or loose stool.  No urinary complaint. In the ER, the blood pressure was 155/90, in room air.  The labs showed serum sodium level of 117 on 7/7 at 8 AM.  Received 500 cc of normal saline in ER.  Urinary studies consistent with urine sodium of 127 and urine osmolality 432.  Thought to be due to SIADH and is started on fluid restriction and salt tablet.  The sodium level remained around 116-118 without much improvement. Today, patient's daughter was at bedside who reported that she still complains of weakness and has change in mental status which is not at her baseline. No fever, chills, chest pain, shortness of breath, dysuria, urgency or frequency.  Creatinine, Ser  Date/Time Value Ref Range Status  09/09/2019 01:30 AM 0.53 0.44 - 1.00 mg/dL Final  31/54/0086 76:19 PM 0.66 0.44 - 1.00 mg/dL Final  50/93/2671 24:58 PM 0.70 0.44 - 1.00 mg/dL Final  09/98/3382 50:53 PM 0.55 0.44 - 1.00 mg/dL Final   97/67/3419 37:90 AM 0.59 0.44 - 1.00 mg/dL Final  24/11/7351 29:92 PM 0.68 0.50 - 1.10 mg/dL Final  42/68/3419 62:22 PM 0.66 0.50 - 1.10 mg/dL Final     PMHx:   Past Medical History:  Diagnosis Date  . Arthritis   . Asthma   . Right bundle branch block   . TIA (transient ischemic attack)     Past Surgical History:  Procedure Laterality Date  . CHOLECYSTECTOMY    . DILATION AND CURETTAGE OF UTERUS    . TONSILLECTOMY      Family Hx:  Family History  Problem Relation Age of Onset  . CAD Mother     Social History:  reports that she has quit smoking. She has never used smokeless tobacco. She reports previous alcohol use. She reports that she does not use drugs.  Allergies: No Known Allergies  Medications: Prior to Admission medications   Medication Sig Start Date End Date Taking? Authorizing Provider  ALPRAZolam (XANAX) 0.25 MG tablet Take 0.125 mg by mouth daily as needed for anxiety.  08/22/19  Yes [provider]  Cholecalciferol (VITAMIN D3) 2000 UNITS capsule Take 2,000 Units by mouth daily.    Yes [provider]  clopidogrel (PLAVIX) 75 MG tablet Take 1 tablet (75 mg total) by mouth at bedtime. 04/25/13  Yes Rhetta Mura, MD  diltiazem (CARDIZEM) 30 MG tablet Take 30 mg by mouth daily as needed (heart palpatation).   Yes [provider]  escitalopram (LEXAPRO) 5 MG tablet Take 5 mg by mouth at  bedtime. 08/19/19  Yes [provider]  loratadine (CLARITIN) 10 MG tablet Take 10 mg by mouth daily as needed. For allergies   Yes [provider]  losartan (COZAAR) 50 MG tablet Take 50 mg by mouth at bedtime.  09/03/19  Yes [provider]  nitroGLYCERIN (NITROSTAT) 0.4 MG SL tablet Place 1 tablet (0.4 mg total) under the tongue every 5 (five) minutes as needed for chest pain. 04/25/13  Yes Rhetta Mura, MD  ondansetron (ZOFRAN-ODT) 4 MG disintegrating tablet Take 4 mg by mouth 5 (five) times daily as needed.  08/19/19  Yes [provider]  pantoprazole (PROTONIX) 40 MG tablet Take 40 mg by mouth 2 (two) times daily. 08/20/19  Yes [provider]    I have reviewed the patient's current medications.  Labs:  Results for orders placed or performed during the hospital encounter of 09/08/19 (from the past 48 hour(s))  Basic metabolic panel     Status: Abnormal   Collection Time: 09/08/19  8:33 AM  Result Value Ref Range   Sodium 117 (LL) 135 - 145 mmol/L    Comment: CRITICAL RESULT CALLED TO, READ BACK BY AND VERIFIED WITH: SMITH,T @ 0939 ON [id number] BY POTEAT,S    Potassium 4.3 3.5 - 5.1 mmol/L   Chloride 83 (L) 98 - 111 mmol/L   CO2 22 22 - 32 mmol/L   Glucose, Bld 160 (H) 70 - 99 mg/dL    Comment: Glucose reference range applies only to samples taken after fasting for at least 8 hours.   BUN 7 (L) 8 - 23 mg/dL   Creatinine, Ser 9.93 0.44 - 1.00 mg/dL   Calcium 8.4 (L) 8.9 - 10.3 mg/dL   GFR calc non Af Amer >60 >60 mL/min   GFR calc Af Amer >60 >60 mL/min   Anion gap 12 5 - 15    Comment: Performed at [facility], [address] [place], [place] [address]  CBC     Status: None   Collection Time: 09/08/19  8:33 AM  Result Value Ref Range   WBC 5.7 4.0 - 10.5 K/uL   RBC 4.69 3.87 - 5.11 MIL/uL   Hemoglobin 14.6 12.0 - 15.0 g/dL   HCT 79.3 36 - 46 %   MCV 87.0 80.0 - 100.0 fL   MCH 31.1 26.0 - 34.0 pg   MCHC 35.8 30.0 - 36.0 g/dL   RDW 90.3 00.9 - 23.3 %   Platelets 320 150 - 400 K/uL   nRBC 0.0 0.0 - 0.2 %    Comment: Performed at [facility], [address], [place], [place] [address]  Hepatic function panel     Status: None   Collection Time: 09/08/19  8:33 AM  Result Value Ref Range   Total Protein 7.8 6.5 - 8.1 g/dL   Albumin 4.1 3.5 - 5.0 g/dL   AST 19 15 - 41 U/L   ALT 18 0 - 44 U/L   Alkaline Phosphatase 65 38 - 126 U/L   Total Bilirubin 0.7 0.3 - 1.2 mg/dL   Bilirubin, Direct <2.6 0.0 - 0.2 mg/dL   Indirect  Bilirubin NOT CALCULATED 0.3 - 0.9 mg/dL    Comment: Performed at [facility], [address], [place], [place] [address]  Troponin I (High Sensitivity)     Status: None   Collection Time: 09/08/19  8:33 AM  Result Value Ref Range   Troponin I (High Sensitivity) 7 <18 ng/L  Comment: (NOTE) Elevated high sensitivity troponin I (hsTnI) values and significant  changes across serial measurements may suggest ACS but many other  chronic and acute conditions are known to elevate hsTnI results.  Refer to the Links section for chest pain algorithms and additional  guidance. Performed at [facility], [address], [place], [place] [address]   Brain natriuretic peptide     Status: None   Collection Time: 09/08/19  8:33 AM  Result Value Ref Range   B Natriuretic Peptide 40.2 0.0 - 100.0 pg/mL    Comment: Performed at [facility], [address] [place], [place] [address]  Urinalysis, Routine w reflex microscopic     Status: Abnormal   Collection Time: 09/08/19  9:50 AM  Result Value Ref Range   Color, Urine YELLOW YELLOW   APPearance HAZY (A) CLEAR   Specific Gravity, Urine 1.011 1.005 - 1.030   pH 7.0 5.0 - 8.0   Glucose, UA 50 (A) NEGATIVE mg/dL   Hgb urine dipstick NEGATIVE NEGATIVE   Bilirubin Urine NEGATIVE NEGATIVE   Ketones, ur NEGATIVE NEGATIVE mg/dL   Protein, ur 30 (A) NEGATIVE mg/dL   Nitrite NEGATIVE NEGATIVE   Leukocytes,Ua TRACE (A) NEGATIVE   RBC / HPF 0-5 0 - 5 RBC/hpf   WBC, UA 0-5 0 - 5 WBC/hpf   Bacteria, UA RARE (A) NONE SEEN   Squamous Epithelial / LPF 0-5 0 - 5    Comment: Performed at [facility], [address] [place], [place] [address]  Sodium, urine, random     Status: None   Collection Time: 09/08/19  9:50 AM  Result Value Ref Range   Sodium, Ur 127 mmol/L    Comment: Performed at [facility], [address] [place], [place] [address]  Osmolality,  urine     Status: None   Collection Time: 09/08/19  9:50 AM  Result Value Ref Range   Osmolality, Ur 432 300 - 900 mOsm/kg    Comment: Performed at [facility], [address] [place], [place] [address]  SARS Coronavirus 2 by RT PCR (hospital order, performed in [facility] hospital lab) Nasopharyngeal Nasopharyngeal Swab     Status: None   Collection Time: 09/08/19 10:53 AM   Specimen: Nasopharyngeal Swab  Result Value Ref Range   SARS Coronavirus 2 NEGATIVE NEGATIVE    Comment: (NOTE) SARS-CoV-2 target nucleic acids are NOT DETECTED.  The SARS-CoV-2 RNA is generally detectable in upper and lower respiratory specimens during the acute phase of infection. The lowest concentration of SARS-CoV-2 viral copies this assay can detect is 250 copies / mL. A negative result does not preclude SARS-CoV-2 infection and should not be used as the sole basis for treatment or other patient management decisions.  A negative result may occur with improper specimen collection / handling, submission of specimen other than nasopharyngeal swab, presence of viral mutation(s) within the areas targeted by this assay, and inadequate number of viral copies (<250 copies / mL). A negative result must be combined with clinical observations, patient history, and epidemiological information.  Fact Sheet for Patients:   BoilerBrush.com.cy  Fact Sheet for Healthcare Providers: https://pope.com/  This test is not yet approved or  cleared by the [place] FDA and has been authorized for detection and/or diagnosis of SARS-CoV-2 by FDA under an Emergency Use Authorization (EUA).  This EUA will remain in effect (meaning this test can be used) for the duration of the COVID-19  declaration under Section 564(b)(1) of the Act, 21 U.S.C. section 360bbb-3(b)(1), unless the authorization is terminated or revoked sooner.  Performed at [facility],  [address] [place], [place] [address]   Troponin I (High Sensitivity)     Status: None   Collection Time: 09/08/19  1:16 PM  Result Value Ref Range   Troponin I (High Sensitivity) 8 <18 ng/L    Comment: (NOTE) Elevated high sensitivity troponin I (hsTnI) values and significant  changes across serial measurements may suggest ACS but many other  chronic and acute conditions are known to elevate hsTnI results.  Refer to the "Links" section for chest pain algorithms and additional  guidance. Performed at [facility], [address] [place], [place] [address]   Osmolality     Status: Abnormal   Collection Time: 09/08/19  1:26 PM  Result Value Ref Range   Osmolality 246 (LL) 275 - 295 mOsm/kg    Comment: REPEATED TO VERIFY CRITICAL RESULT CALLED TO, READ BACK BY AND VERIFIED WITH: Gildardo CrankerWL J MAYS RN 1729 [id number] BY K Amoret Performed at [facility], [address] [place], [place] [address]   Basic metabolic panel     Status: Abnormal   Collection Time: 09/08/19  1:26 PM  Result Value Ref Range   Sodium 116 (LL) 135 - 145 mmol/L    Comment: CRITICAL RESULT CALLED TO, READ BACK BY AND VERIFIED WITH: R.MICHAEL AT 1454 ON 09/08/19 BY N.THOMPSON    Potassium 4.0 3.5 - 5.1 mmol/L   Chloride 84 (L) 98 - 111 mmol/L   CO2 21 (L) 22 - 32 mmol/L   Glucose, Bld 132 (H) 70 - 99 mg/dL    Comment: Glucose reference range applies only to samples taken after fasting for at least 8 hours.   BUN 6 (L) 8 - 23 mg/dL   Creatinine, Ser 6.210.55 0.44 - 1.00 mg/dL   Calcium 8.1 (L) 8.9 - 10.3 mg/dL   GFR calc non Af Amer >60 >60 mL/min   GFR calc Af Amer >60 >60 mL/min   Anion gap 11 5 - 15    Comment: Performed at [facility], [address] [place], [place] [address]  TSH     Status: None   Collection Time: 09/08/19  1:26 PM  Result Value Ref Range   TSH 4.381 0.350 - 4.500 uIU/mL    Comment: Performed by a 3rd Generation assay with a functional  sensitivity of <=0.01 uIU/mL. Performed at [facility], [address] [place], [place] [address]   Cortisol     Status: None   Collection Time: 09/08/19  1:26 PM  Result Value Ref Range   Cortisol, Plasma 15.9 ug/dL    Comment: (NOTE) AM    6.7 - 22.6 ug/dL PM   <62.9<10.0       ug/dL Performed at [facility], [address] [place], [place] [address]   Basic metabolic panel     Status: Abnormal   Collection Time: 09/08/19  5:38 PM  Result Value Ref Range   Sodium 117 (LL) 135 - 145 mmol/L    Comment: CRITICAL RESULT CALLED TO, READ BACK BY AND VERIFIED WITH: MAYS,J AT 1808 ON 09/08/2019 BY MOSLEY,J     Potassium 3.9 3.5 - 5.1 mmol/L   Chloride 86 (L) 98 - 111 mmol/L   CO2 23 22 - 32 mmol/L   Glucose, Bld 125 (H) 70 - 99 mg/dL  Comment: Glucose reference range applies only to samples taken after fasting for at least 8 hours.   BUN 10 8 - 23 mg/dL   Creatinine, Ser 1.61 0.44 - 1.00 mg/dL   Calcium 8.1 (L) 8.9 - 10.3 mg/dL   GFR calc non Af Amer >60 >60 mL/min   GFR calc Af Amer >60 >60 mL/min   Anion gap 8 5 - 15    Comment: Performed at [facility], [address] [place], [place] [address]  Basic metabolic panel     Status: Abnormal   Collection Time: 09/08/19 10:05 PM  Result Value Ref Range   Sodium 117 (LL) 135 - 145 mmol/L    Comment: CRITICAL RESULT CALLED TO, READ BACK BY AND VERIFIED WITH: DARTAVIA, RN @ 2229 ON 09/08/19 C VARNER    Potassium 4.7 3.5 - 5.1 mmol/L    Comment: DELTA CHECK NOTED   Chloride 86 (L) 98 - 111 mmol/L   CO2 22 22 - 32 mmol/L   Glucose, Bld 112 (H) 70 - 99 mg/dL    Comment: Glucose reference range applies only to samples taken after fasting for at least 8 hours.   BUN 13 8 - 23 mg/dL   Creatinine, Ser 5.40 0.44 - 1.00 mg/dL   Calcium 8.2 (L) 8.9 - 10.3 mg/dL   GFR calc non Af Amer >60 >60 mL/min   GFR calc Af Amer >60 >60 mL/min   Anion gap 9 5 - 15    Comment: Performed at [facility], [address] [place], [place] [address]  Basic metabolic panel     Status: Abnormal   Collection Time: 09/09/19  1:30 AM  Result Value Ref Range   Sodium 118 (LL) 135 - 145 mmol/L    Comment: CRITICAL RESULT CALLED TO, READ BACK BY AND VERIFIED WITH: DARTAVIA, RN @ 0202 ON 09/09/19 C VARNER    Potassium 4.4 3.5 - 5.1 mmol/L   Chloride 88 (L) 98 - 111 mmol/L   CO2 21 (L) 22 - 32 mmol/L   Glucose, Bld 114 (H) 70 - 99 mg/dL    Comment: Glucose reference range applies only to samples taken after fasting for at least 8 hours.   BUN 10 8 - 23 mg/dL   Creatinine, Ser 1.47 0.44 - 1.00 mg/dL   Calcium 8.1 (L) 8.9 - 10.3 mg/dL   GFR calc non Af Amer >60 >60 mL/min   GFR calc Af Amer >60 >60 mL/min   Anion gap 9 5 - 15    Comment: Performed at [facility], [address], [place], [place] [address]  CBC with Differential/Platelet     Status: Abnormal   Collection Time: 09/09/19  1:30 AM  Result Value Ref Range   WBC 5.0 4.0 - 10.5 K/uL   RBC 3.94 3.87 - 5.11 MIL/uL   Hemoglobin 12.1 12.0 - 15.0 g/dL   HCT 21.3 (L) 36 - 46 %   MCV 87.1 80.0 - 100.0 fL   MCH 30.7 26.0 - 34.0 pg   MCHC 35.3 30.0 - 36.0 g/dL   RDW 08.6 57.8 - 46.9 %   Platelets 273 150 - 400 K/uL   nRBC 0.0 0.0 - 0.2 %   Neutrophils Relative % 76 %   Neutro Abs 3.7 1.7 - 7.7 K/uL   Lymphocytes Relative 11 %   Lymphs Abs 0.6 (L) 0.7 - 4.0 K/uL   Monocytes Relative 12 %   Monocytes Absolute 0.6 0 -  1 K/uL   Eosinophils Relative 1 %   Eosinophils Absolute 0.0 0 - 0 K/uL   Basophils Relative 0 %   Basophils Absolute 0.0 0 - 0 K/uL   Immature Granulocytes 0 %   Abs Immature Granulocytes 0.02 0.00 - 0.07 K/uL    Comment: Performed at [facility], [address] [place], [place] [address]  Hemoglobin A1c     Status: None   Collection Time: 09/09/19  1:30 AM  Result Value Ref Range   Hgb A1c MFr Bld 5.5 4.8 - 5.6 %    Comment: (NOTE) Pre diabetes:           5.7%-6.4%  Diabetes:              >6.4%  Glycemic control for   <7.0% adults with diabetes    Mean Plasma Glucose 111.15 mg/dL    Comment: Performed at [facility], [address] [place], [place] [address]     ROS:  Pertinent items noted in HPI and remainder of comprehensive ROS otherwise negative.  Physical Exam: Vitals:   09/08/19 2355 09/09/19 0514  BP: (!) 152/90 (!) 160/82  Pulse: 73 72  Resp: 18 19  Temp: 97.9 F (36.6 C) 98 F (36.7 C)  SpO2: 95% 98%     General exam: Appears calm and comfortable  Respiratory system: Clear to auscultation. Respiratory effort normal. No wheezing or crackle Cardiovascular system: S1 & S2 heard, RRR.  No pedal edema. Gastrointestinal system: Abdomen is nondistended, soft and nontender. Normal bowel sounds heard. Central nervous system: Alert awake and oriented to herself and hospital.  Not oriented to time month or year. No focal neurological deficits. Extremities: Symmetric 5 x 5 power. 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  No evidence of GI loss or decreased oral intake.Urine sodium 127, urine osmolality 432. Cortisol and TSH level unremarkable. Sodium level did not improve with fluid restriction and salt tablet. She remains symptomatic therefore plan to start 3% hypertonic saline with close monitoring of serum sodium and neurological check.  Start 35 cc/h, can be given via peripheral line in the floor.  Goal sodium correction around 125 by tomorrow morning. Discontinue Lexapro.  #Hypertension: Blood pressure mildly elevated.  Volume status acceptable.  Continue home medication.  #Dizziness, fatigue and change in mental status probably because of hyponatremia: Management as above.  She most likely has underlying age-related dementia.  Supportive care, per primary team.  #History of breast cancer  status post completion of chemotherapy in 2020.  Discussed with primary team. Thank you for the consult.  We will follow.  Caleel Kiner Jaynie Collins 09/09/2019, 8:26 AM  [facility].

## 2019-09-09 NOTE — Progress Notes (Signed)
PROGRESS NOTE  Jeanette Arroyo  BHA:[id number] DOB: 1921-02-22 DOA: 09/08/2019 PCP: Dr. Alease Frame Brief Narrative: Per HPI: Jeanette Arroyo is a 86 y.o. female with medical history significant of TIA, chronic right BBB, asthma, breast cancer s/p completion of chemotherapy in 2020 who presented with dizziness and fatigue.  Patient is a poor historian and H&P was obtained by chart review and talking to patient and her daughter at bedside  Patient usually lives with her son at [place], [place], and she recently moves in with her daughter a few days ago.   Patient reports that she has had intermittent lightheadedness, dizziness and fatigue for a few days.  Associated symptom included mild nausea, "acid reflux" symptoms and generalized weakness.  She apparently has had intermittently high and low blood pressures for a few weeks. Her home diltiazem 300 mg was changed to 30 mg as needed, and losartan was added to her regimen by her PCP.  She continued to have elevated SBP of 190's at home and noted to have " irregular rhythm" on home BP monitor. Per daughter, [name] was added for anxiety about 3 weeks ago, and Protonix was added about 1 week ago.  This morning patient woke up feeling dizziness, lightheadedness with one near syncope episode.  Denies syncope , loss of consciousness, headaches, blurry vision, focal numbness/weakness, seizures or confusion.  Patient came to the emergency room for further evaluation.  In the emergency room, she was afebrile with pulse 89, RR 19, BP 159/83 and room air O2 sats 99%.  The labs showed sodium 117, chloride 83, creatinine 0.59, negative troponin, negative proBNP, nonrevealing CBC, negative UA.  EKG showed NSR with RBBB. CXR showed no acute changes.  Patient received saline 500 cc bolus at the ED.  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 Cortisol and TSH ok. Urine osm >100 and UNa >40 consistent with inappropriate ADH/reset osmostat. - Holding SSRI. Escitalopram t1/2 17-29hrs with anticipated increase by 50% in elderly patients, last dose 7/6 PM. - Salt tabs ordered, will return to po Tx once Na stabilized, continue to restrict free water.   - Nephrology consulted. With increased confusion, treating as symptomatic hyponatremia, transferring to ICU for serial Na checks, frequent neurochecks, seizure precautions, and hypertonic saline at 35cc/hr. Goal rate of correction is aiming for 125 on 7/9 AM labs. Seeing some appropriate rise throughout afternoon. - Urine studies ordered and pending.   Anxiety:  - Stopping lexapro. Given relatively longer half-life and short duration of therapy, do not need to taper, but will monitor for effects.  - Discussed initiation of buspar, but will hold off for now. Will continue very low dose po xanax prn - Request unrestricted visitation status to minimize effects of delirium.  Hypocalcemia:  - To stabilize myocardium, will supplement and monitor. Note albumin was wnl. QTc on cardiac monitoring currently on my personal review.  - Continue vitamin D supplement  History of TIA:  - Plavix. Statin.   HTN: Labile BPs in recent days.  - Diltiazem 30mg  q6h to be continued. BP under reasonable control without hypotension. Widened pulse pressure noted.  - Continue  ARB, on stable dose.   Acute metabolic encephalopathy: Likely related to hyponatremia, though pseudodementia is a concern with concurrent mood disorder.  - Continue monitoring - Delirium precautions ordered - Would benefit from outpatient neuropsychiatry evaluation.     Triple negative right breast cancer s/p lumpectomy then mastectomy with recurrence, salvage XRT completed Nov 2020. Most recent note reports NED. - Continue oncology follow up as outpatient.   [facility]   PACs, chronic RBBB: Noted.  - Continue telemetry.   GERD:  - Protonix daily with plans to continue work up as outpatient.  DVT prophylaxis: Lovenox 40mg  q24h (BMI 27, CrCl >430ml/min) Code Status: DNR confirmed Family Communication: Daughter at bedside this AM, spoke with their spouse by phone.  Disposition Plan:  Status is: Inpatient  Remains inpatient appropriate because:Persistent severe electrolyte disturbances  Dispo: The patient is from: Home              Anticipated d/c is to: Home              Anticipated d/c date is: 3 days              Patient currently is not medically stable to d/c.  Consultants:   Nephrology, Dr. Ronalee BeltsBhandari 7/8  Procedures:   Hypertonic saline 7/8 >>  Antimicrobials:  None   Subjective: Mental clouding noted by daughter this AM, has been groggy throughout the day, but eating ok. No specific complaints. Hasn't gotten up. Denies CP, SOB, palpitations currently.   Objective: Vitals:   09/09/19 1000 09/09/19 1100 09/09/19 1200 09/09/19 1600  BP: (!) 149/50 (!) 140/41    Pulse:      Resp: 19 (!) 22    Temp: 97.6 F (36.4 C)  97.6 F (36.4 C) 97.8 F (36.6 C)  TempSrc: Oral  Oral Oral  SpO2:      Weight: 66.4 kg     Height: 5\' 1"  (1.549 m)       Intake/Output Summary (Last 24 hours) at 09/09/2019 1648 Last data filed at 09/09/2019 1010 Gross per 24 hour  Intake 360 ml  Output --  Net 360 ml   Filed Weights   09/08/19 1110 09/09/19 1000  Weight: 66.7 kg 66.4 kg   Gen: Pleasant, elderly female in no distress, content to not take part in most of conversation Pulm: Non-labored breathing room air. Clear to auscultation bilaterally.  CV: Regular rate and rhythm. No murmur, rub, or gallop. No JVD, no  pitting pedal edema this AM or PM. GI: Abdomen soft, non-tender, non-distended, with normoactive bowel sounds. No organomegaly or masses felt. Ext: Warm, Multiple hand deformities. Skin: No significant rashes, lesions or ulcers Neuro: Drowsy but rousable and reasonably interactive, but no oriented (which was her PTA baseline). No focal sensorimotor deficits. Psych: Judgement and insight appear mildly impaired. Mood & affect appropriate.   Data Reviewed: I have personally reviewed following labs and imaging studies  CBC: Recent Labs  Lab 09/08/19 0833 09/09/19 0130  WBC 5.7 5.0  NEUTROABS  --  3.7  HGB 14.6 12.1  HCT 40.8 34.3*  MCV 87.0 87.1  PLT 320 273   Basic Metabolic Panel: Recent Labs  Lab 09/08/19 1738 09/08/19 1738 09/08/19 2205 09/09/19 0130 09/09/19 0852 09/09/19 1108 09/09/19 1427  NA 117*   < > 117* 118* 121* 121* 122*  K 3.9  --  4.7 4.4  --  4.2 4.3  CL 86*  --  86* 88*  --  89* 90*  CO2 23  --  22 21*  --  20* 23  GLUCOSE 125*  --  112* 114*  --  118* 101*  BUN 10  --  13 10  --  8 11  CREATININE 0.70  --  0.66 0.53  --  0.50 0.56  CALCIUM 8.1*  --  8.2* 8.1*  --  8.2* 8.0*   < > = values in this interval not displayed.   GFR: Estimated Creatinine Clearance: 35.8 mL/min (by C-G formula based on SCr of 0.56 mg/dL). Liver Function Tests: Recent Labs  Lab 09/08/19 0833  AST 19  ALT 18  ALKPHOS 65  BILITOT 0.7  PROT 7.8  ALBUMIN 4.1   No results for input(s): LIPASE, AMYLASE in the last 168 hours. No results for input(s): AMMONIA in the last 168 hours. Coagulation Profile: No results for input(s): INR, PROTIME in the last 168 hours. Cardiac Enzymes: No results for input(s): CKTOTAL, CKMB, CKMBINDEX, TROPONINI in the last 168 hours. BNP (last 3 results) No results for input(s): PROBNP in the last 8760 hours. HbA1C: Recent Labs    09/09/19 0130  HGBA1C 5.5   CBG: No results for input(s): GLUCAP in the last 168 hours. Lipid Profile: No  results for input(s): CHOL, HDL, LDLCALC, TRIG, CHOLHDL, LDLDIRECT in the last 72 hours. Thyroid Function Tests: Recent Labs    09/08/19 1326  TSH 4.381   Anemia Panel: No results for input(s): VITAMINB12, FOLATE, FERRITIN, TIBC, IRON, RETICCTPCT in the last 72 hours. Urine analysis:    Component Value Date/Time   COLORURINE YELLOW 09/08/2019 0950   APPEARANCEUR HAZY (A) 09/08/2019 0950   LABSPEC 1.011 09/08/2019 0950   PHURINE 7.0 09/08/2019 0950   GLUCOSEU 50 (A) 09/08/2019 0950   HGBUR NEGATIVE 09/08/2019 0950   BILIRUBINUR NEGATIVE 09/08/2019 0950   KETONESUR NEGATIVE 09/08/2019 0950   PROTEINUR 30 (A) 09/08/2019 0950   UROBILINOGEN 0.2 04/22/2013 2125   NITRITE NEGATIVE 09/08/2019 0950   LEUKOCYTESUR TRACE (A) 09/08/2019 0950   Recent Results (from the past 240 hour(s))  SARS Coronavirus 2 by RT PCR (hospital order, performed in [facility] hospital lab) Nasopharyngeal Nasopharyngeal Swab     Status: None   Collection Time: 09/08/19 10:53 AM   Specimen: Nasopharyngeal Swab  Result Value Ref Range Status   SARS Coronavirus 2 NEGATIVE NEGATIVE Final    Comment: (NOTE) SARS-CoV-2 target nucleic acids are NOT DETECTED.  The SARS-CoV-2 RNA is generally detectable in upper and lower respiratory specimens during the acute phase of infection. The lowest concentration of SARS-CoV-2 viral copies this assay can detect is 250 copies / mL. A negative result does not preclude SARS-CoV-2 infection and should not be used as the sole basis for treatment or other patient management decisions.  A negative result may occur with improper specimen collection / handling, submission of specimen other than nasopharyngeal swab, presence of viral mutation(s) within the areas targeted by this assay, and inadequate number of viral copies (<250 copies / mL). A negative result must be combined with clinical observations, patient history, and epidemiological information.  Fact Sheet for Patients:    BoilerBrush.com.cy  Fact Sheet for Healthcare Providers: https://pope.com/  This test is not yet approved or  cleared by the [place] FDA and has been authorized for detection and/or diagnosis of SARS-CoV-2 by FDA under an Emergency Use Authorization (EUA).  This EUA will remain in effect (meaning this test can be used) for the duration of the COVID-19 declaration under Section 564(b)(1) of the  Act, 21 U.S.C. section 360bbb-3(b)(1), unless the authorization is terminated or revoked sooner.  Performed at [facility], [address] [place], [place] [address]   MRSA PCR Screening     Status: None   Collection Time: 09/09/19  9:39 AM   Specimen: Nasal Mucosa; Nasopharyngeal  Result Value Ref Range Status   MRSA by PCR NEGATIVE NEGATIVE Final    Comment:        The GeneXpert MRSA Assay (FDA approved for NASAL specimens only), is one component of a comprehensive MRSA colonization surveillance program. It is not intended to diagnose MRSA infection nor to guide or monitor treatment for MRSA infections. Performed at [facility], [address] [place], [place] [address]       Radiology Studies: DG Chest 2 View  Result Date: 09/08/2019 CLINICAL DATA:  Irregular heart rate. EXAM: CHEST - 2 VIEW COMPARISON:  April 22, 2013. FINDINGS: Stable cardiomediastinal silhouette. No pneumothorax or pleural effusion is noted. Lungs are clear. Bony thorax is unremarkable. IMPRESSION: No active cardiopulmonary disease. Aortic Atherosclerosis (ICD10-I70.0). Electronically Signed   By: Lupita Raider M.D.   On: 09/08/2019 09:08   CT HEAD WO CONTRAST  Result Date: 09/08/2019 CLINICAL DATA:  Presyncope, unexplained. Fatigue, dizziness, lightheadedness, high blood pressure. EXAM: CT HEAD WITHOUT CONTRAST TECHNIQUE: Contiguous axial images were obtained from the base of the skull through the vertex without  intravenous contrast. COMPARISON:  Prior head CT examination 01/12/2012 FINDINGS: Brain: Stable, mild generalized parenchymal atrophy. There is no acute intracranial hemorrhage. No demarcated cortical infarct. No extra-axial fluid collection. No evidence of intracranial mass. No midline shift. Vascular: No hyperdense vessel.  Atherosclerotic calcifications. Skull: Normal. Negative for fracture or focal lesion. Sinuses/Orbits: Visualized orbits show no acute finding. Mild ethmoid sinus mucosal thickening. No significant mastoid effusion IMPRESSION: No CT evidence of acute intracranial abnormality. Stable, mild generalized parenchymal atrophy. Mild ethmoid sinus mucosal thickening Electronically Signed   By: Jackey Loge DO   On: 09/08/2019 11:51    Scheduled Meds: . Chlorhexidine Gluconate Cloth  6 each Topical Daily  . cholecalciferol  2,000 Units Oral Daily  . clopidogrel  75 mg Oral QHS  . diltiazem  30 mg Oral Q6H  . enoxaparin (LOVENOX) injection  40 mg Subcutaneous Q24H  . losartan  50 mg Oral QHS  . pantoprazole  40 mg Oral BID  . sodium chloride flush  3 mL Intravenous Once   Continuous Infusions: . calcium gluconate    . sodium chloride (hypertonic) 35 mL/hr at 09/09/19 1010     LOS: 0 days   Time spent: 35 minutes.  Tyrone Nine, MD [facility] [email or web address] 09/09/2019, 4:48 PM

## 2019-09-10 LAB — SODIUM
Sodium: 126 mmol/L — ABNORMAL LOW (ref 135–145)
Sodium: 128 mmol/L — ABNORMAL LOW (ref 135–145)
Sodium: 126 mmol/L — ABNORMAL LOW (ref 135–145)

## 2019-09-10 LAB — BASIC METABOLIC PANEL
Anion gap: 8 (ref 5–15)
BUN: 12 mg/dL (ref 8–23)
CO2: 24 mmol/L (ref 22–32)
Calcium: 8.2 mg/dL — ABNORMAL LOW (ref 8.9–10.3)
Chloride: 93 mmol/L — ABNORMAL LOW (ref 98–111)
Creatinine, Ser: 0.68 mg/dL (ref 0.44–1.00)
GFR calc Af Amer: >60 mL/min (ref >60)
GFR calc non Af Amer: >60 mL/min (ref >60)
Glucose, Bld: 101 mg/dL — ABNORMAL HIGH (ref 70–99)
Potassium: 4.4 mmol/L (ref 3.5–5.1)
Sodium: 125 mmol/L — ABNORMAL LOW (ref 135–145)

## 2019-09-10 MED ORDER — AMLODIPINE BESYLATE 5 MG PO TABS
5.0000 mg | ORAL_TABLET | Freq: Two times a day (BID) | ORAL | Status: DC
  Administered 2019-09-10 – 2019-09-13 (×6): 5 mg via ORAL
  Filled 2019-09-10 (×7): qty 1

## 2019-09-10 MED ORDER — SODIUM CHLORIDE 3 % IV SOLN
INTRAVENOUS | Status: DC
  Filled 2019-09-10: qty 500

## 2019-09-10 NOTE — Progress Notes (Signed)
PROGRESS NOTE  Emmer Lillibridge  IPJ:[id number] DOB: [birth year] DOA: 09/08/2019 PCP: Dr. Alease Frame Brief Narrative: Per HPI: Lasharon Dunivan is a 86 y.o. female with medical history significant of TIA, chronic right BBB, asthma, breast cancer s/p completion of chemotherapy in 2020 who presented with dizziness and fatigue.  Patient is a poor historian and H&P was obtained by chart review and talking to patient and her daughter at bedside  Patient usually lives with her son at [place], [place], and she recently moves in with her daughter a few days ago.   Patient reports that she has had intermittent lightheadedness, dizziness and fatigue for a few days.  Associated symptom included mild nausea, "acid reflux" symptoms and generalized weakness.  She apparently has had intermittently high and low blood pressures for a few weeks. Her home diltiazem 300 mg was changed to 30 mg as needed, and losartan was added to her regimen by her PCP.  She continued to have elevated SBP of 190's at home and noted to have " irregular rhythm" on home BP monitor. Per daughter, [name] was added for anxiety about 3 weeks ago, and Protonix was added about 1 week ago.  This morning patient woke up feeling dizziness, lightheadedness with one near syncope episode.  Denies syncope , loss of consciousness, headaches, blurry vision, focal numbness/weakness, seizures or confusion.  Patient came to the emergency room for further evaluation.  In the emergency room, she was afebrile with pulse 89, RR 19, BP 159/83 and room air O2 sats 99%.  The labs showed sodium 117, chloride 83, creatinine 0.59, negative troponin, negative proBNP, nonrevealing CBC, negative UA.  EKG showed NSR with RBBB. CXR showed no acute changes.  Patient received saline 500 cc bolus at the ED.  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 Cortisol and TSH ok. Urine osm >100 and UNa >40 consistent with inappropriate ADH/reset osmostat. UNa is declining consistent with improved renal resorption. - Holding SSRI. Escitalopram t1/2 17-29hrs with anticipated increase by 50% in elderly patients, last dose 7/6 PM. - Continue to restrict free water. Escalate treatment if needed. Defer to nephrology. - Nephrology assistance appreciated.  Anxiety:  - Stopped lexapro. Given relatively longer half-life and short duration of therapy, do not need to taper, but will monitor for effects.  - Discussed initiation of buspar, but will hold off for now. Continuing very low dose po xanax prn - Requested unrestricted visitation status to minimize effects of delirium, stable.  Hypocalcemia:  - Supplemented. Note albumin was wnl. QTc on cardiac monitoring currently on my personal review.  - Continue vitamin D supplement  History of TIA:  - Plavix. Statin.   HTN: Labile BPs in recent days.  - Diltiazem 30mg  q6h to be continued. BP under reasonable control without hypotension. Widened pulse pressure noted.  - Agree with stopping ARB for now. Replace w/norvasc. Cannot see that this has been tried in the past.   Acute metabolic encephalopathy: Likely related to hyponatremia as this has improved. Query element of pseudodementia with concurrent mood disorder.  - Delirium precautions, minimize room changes. - Would benefit from outpatient neuropsychiatry  evaluation.   Triple negative right breast cancer s/p lumpectomy then mastectomy with recurrence, salvage XRT completed Nov 2020. Most recent note reports NED. - Continue oncology follow up as outpatient.   Melan[facility]/p TVEC  [facility].   PACs, chronic RBBB: Noted.  - Continue telemetry.   GERD:  - Protonix daily with plans to continue work up as outpatient.  DVT prophylaxis: Lovenox 40mg  q24h (BMI 27, CrCl >6430ml/min) Code Status: DNR confirmed Family Communication: Daughter at bedside this AM, spoke with their spouse by phone. Disposition Plan:  Status is: Inpatient  Remains inpatient appropriate because:Persistent severe electrolyte disturbances. Will remain in ICU pending stability of sodium level. Possible transfer to floor 09/11/2019  Dispo: The patient is from: Home              Anticipated d/c is to: Home              Anticipated d/c date is: 2 days              Patient currently is not medically stable to d/c.  Consultants:   Nephrology 7/8  Procedures:   Hypertonic saline 7/8 - 7/9  Antimicrobials:  None   Subjective: Confusion much better per daughter. No severe delirium overnight. Eating ok, no swelling noted. Denied shortness of breath or chest pain.   Objective: Vitals:   09/10/19 1219 09/10/19 1300 09/10/19 1400 09/10/19 1500  BP:      Pulse:    89  Resp:  19 20 (!) 26  Temp: 98.1 F (36.7 C)     TempSrc: Oral     SpO2:    100%  Weight:      Height:        Intake/Output Summary (Last 24 hours) at 09/10/2019 1520 Last data filed at 09/10/2019 1200 Gross per 24 hour  Intake 1727.51 ml  Output 1850 ml  Net -122.49 ml   Filed Weights   09/08/19 1110 09/09/19 1000  Weight: 66.7 kg 66.4 kg   Gen: Pleasant elderly female in no distress Pulm: Nonlabored breathing room air. Clear. CV: Regular rate and rhythm. No murmur, rub, or gallop. No JVD, no dependent edema. GI: Abdomen soft, non-tender, non-distended, with normoactive bowel sounds.  Ext: Warm, no deformities Skin: No rashes, lesions or ulcers on visualized skin. Neuro: Alert and interactive, not completely oriented. HOH but without focal neurological deficits. Psych: Judgement and insight appear fair. Mood  euthymic & affect congruent. Behavior is appropriate.    Data Reviewed: I have personally reviewed following labs and imaging studies  CBC: Recent Labs  Lab 09/08/19 0833 09/09/19 0130  WBC 5.7 5.0  NEUTROABS  --  3.7  HGB 14.6 12.1  HCT 40.8 34.3*  MCV 87.0 87.1  PLT 320 273   Basic Metabolic Panel: Recent Labs  Lab 09/09/19 0130 09/09/19 0852 09/09/19 1108 09/09/19 1108 09/09/19 1427 09/09/19 1427 09/09/19 1745 09/09/19 2320 09/10/19 0232 09/10/19 1008 09/10/19 1337  NA 118*   < > 121*   < > 122*   < > 121* 124* 125* 126* 128*  K 4.4  --  4.2  --  4.3  --  4.4  --  4.4  --   --   CL 88*  --  89*  --  90*  --  90*  --  93*  --   --   CO2 21*  --  20*  --  23  --  22  --  24  --   --  GLUCOSE 114*  --  118*  --  101*  --  103*  --  101*  --   --   BUN 10  --  8  --  11  --  14  --  12  --   --   CREATININE 0.53  --  0.50  --  0.56  --  0.66  --  0.68  --   --   CALCIUM 8.1*  --  8.2*  --  8.0*  --  8.1*  --  8.2*  --   --    < > = values in this interval not displayed.   GFR: Estimated Creatinine Clearance: 35.8 mL/min (by C-G formula based on SCr of 0.68 mg/dL). Liver Function Tests: Recent Labs  Lab 09/08/19 0833  AST 19  ALT 18  ALKPHOS 65  BILITOT 0.7  PROT 7.8  ALBUMIN 4.1   No results for input(s): LIPASE, AMYLASE in the last 168 hours. No results for input(s): AMMONIA in the last 168 hours. Coagulation Profile: No results for input(s): INR, PROTIME in the last 168 hours. Cardiac Enzymes: No results for input(s): CKTOTAL, CKMB, CKMBINDEX, TROPONINI in the last 168 hours. BNP (last 3 results) No results for input(s): PROBNP in the last 8760 hours. HbA1C: Recent Labs    09/09/19 0130  HGBA1C 5.5   CBG: No results for input(s): GLUCAP in the last 168 hours. Lipid Profile: No results for input(s): CHOL, HDL, LDLCALC, TRIG, CHOLHDL, LDLDIRECT in the last 72 hours. Thyroid Function Tests: Recent Labs    09/08/19 1326  TSH 4.381   Anemia  Panel: No results for input(s): VITAMINB12, FOLATE, FERRITIN, TIBC, IRON, RETICCTPCT in the last 72 hours. Urine analysis:    Component Value Date/Time   COLORURINE YELLOW 09/08/2019 0950   APPEARANCEUR HAZY (A) 09/08/2019 0950   LABSPEC 1.011 09/08/2019 0950   PHURINE 7.0 09/08/2019 0950   GLUCOSEU 50 (A) 09/08/2019 0950   HGBUR NEGATIVE 09/08/2019 0950   BILIRUBINUR NEGATIVE 09/08/2019 0950   KETONESUR NEGATIVE 09/08/2019 0950   PROTEINUR 30 (A) 09/08/2019 0950   UROBILINOGEN 0.2 04/22/2013 2125   NITRITE NEGATIVE 09/08/2019 0950   LEUKOCYTESUR TRACE (A) 09/08/2019 0950   Recent Results (from the past 240 hour(s))  SARS Coronavirus 2 by RT PCR (hospital order, performed in [facility] hospital lab) Nasopharyngeal Nasopharyngeal Swab     Status: None   Collection Time: 09/08/19 10:53 AM   Specimen: Nasopharyngeal Swab  Result Value Ref Range Status   SARS Coronavirus 2 NEGATIVE NEGATIVE Final    Comment: (NOTE) SARS-CoV-2 target nucleic acids are NOT DETECTED.  The SARS-CoV-2 RNA is generally detectable in upper and lower respiratory specimens during the acute phase of infection. The lowest concentration of SARS-CoV-2 viral copies this assay can detect is 250 copies / mL. A negative result does not preclude SARS-CoV-2 infection and should not be used as the sole basis for treatment or other patient management decisions.  A negative result may occur with improper specimen collection / handling, submission of specimen other than nasopharyngeal swab, presence of viral mutation(s) within the areas targeted by this assay, and inadequate number of viral copies (<250 copies / mL). A negative result must be combined with clinical observations, patient history, and epidemiological information.  Fact Sheet for Patients:   BoilerBrush.com.cy  Fact Sheet for Healthcare Providers: https://pope.com/  This test is not yet approved or   cleared by the [place] FDA and has been authorized for detection and/or diagnosis  of SARS-CoV-2 by FDA under an Emergency Use Authorization (EUA).  This EUA will remain in effect (meaning this test can be used) for the duration of the COVID-19 declaration under Section 564(b)(1) of the Act, 21 U.S.C. section 360bbb-3(b)(1), unless the authorization is terminated or revoked sooner.  Performed at [facility], [address], [place], [place] [address]   MRSA PCR Screening     Status: None   Collection Time: 09/09/19  9:39 AM   Specimen: Nasal Mucosa; Nasopharyngeal  Result Value Ref Range Status   MRSA by PCR NEGATIVE NEGATIVE Final    Comment:        The GeneXpert MRSA Assay (FDA approved for NASAL specimens only), is one component of a comprehensive MRSA colonization surveillance program. It is not intended to diagnose MRSA infection nor to guide or monitor treatment for MRSA infections. Performed at [facility], [address] [place], [place] [address]       Radiology Studies: No results found.  Scheduled Meds: . amLODipine  5 mg Oral BID  . Chlorhexidine Gluconate Cloth  6 each Topical Daily  . cholecalciferol  2,000 Units Oral Daily  . clopidogrel  75 mg Oral QHS  . diltiazem  30 mg Oral Q6H  . enoxaparin (LOVENOX) injection  40 mg Subcutaneous Q24H  . pantoprazole  40 mg Oral BID  . sodium chloride flush  3 mL Intravenous Once   Continuous Infusions:    LOS: 1 day   Time spent: 35 minutes.  Tyrone Nine, MD [facility] [email or web address] 09/10/2019, 3:20 PM

## 2019-09-10 NOTE — TOC Initial Note (Signed)
Transition of Care [facility]) - Initial/Assessment Note    Patient Details  Name: Jeanette Arroyo MRN: [id number] Date of Birth: 1920-11-02  Transition of Care [facility]) CM/SW Contact:    Golda Acre, RN Phone Number: 09/10/2019, 8:13 AM  Clinical Narrative:                 From home has pcp will return to home [place]. Hyponatremia- NA 125 [id number]. Iv ns 3% at 35cc/hr. Awake and reactive Plan is to go home. Expected Discharge Plan: Home/Self Care Barriers to Discharge: Continued Medical Work up   Patient Goals and CMS Choice Patient states their goals for this hospitalization and ongoing recovery are:: i would like to go back to my home CMS Medicare.gov Compare Post Acute Care list provided to:: Patient Choice offered to / list presented to : Patient, Adult Children  Expected Discharge Plan and Services Expected Discharge Plan: Home/Self Care   Discharge Planning Services: CM Consult   Living arrangements for the past 2 months: Single Family Home                                      Prior Living Arrangements/Services Living arrangements for the past 2 months: Single Family Home Lives with:: Spouse Patient language and need for interpreter reviewed:: Yes Do you feel safe going back to the place where you live?: Yes      Need for Family Participation in Patient Care: Yes (Comment) Care giver support system in place?: Yes (comment)   Criminal Activity/Legal Involvement Pertinent to Current Situation/Hospitalization: No - Comment as needed  Activities of Daily Living Home Assistive Devices/Equipment: Environmental consultant (specify type), Eyeglasses, Cane (specify quad or straight) (single point cane, front wheeled walker) ADL Screening (condition at time of admission) Patient's cognitive ability adequate to safely complete daily activities?: Yes Is the patient deaf or have difficulty hearing?: Yes Does the patient have difficulty seeing, even when wearing glasses/contacts?:  No Does the patient have difficulty concentrating, remembering, or making decisions?: Yes Patient able to express need for assistance with ADLs?: Yes Does the patient have difficulty dressing or bathing?: No Independently performs ADLs?: Yes (appropriate for developmental age) Does the patient have difficulty walking or climbing stairs?: Yes Weakness of Legs: Left (arthritis in left knee) Weakness of Arms/Hands: None  Permission Sought/Granted                  Emotional Assessment Appearance:: Appears stated age     Orientation: : Oriented to Self, Oriented to Place, Oriented to  Time, Oriented to Situation Alcohol / Substance Use: Not Applicable Psych Involvement: No (comment)  Admission diagnosis:  Hyponatremia [E87.1] Patient Active Problem List   Diagnosis Date Noted  . Hyponatremia 09/08/2019  . Dizziness 09/08/2019  . Uncontrolled hypertension 09/08/2019  . History of right bundle branch block (RBBB) 09/08/2019  . Asthma, chronic, unspecified asthma severity, with acute exacerbation 09/08/2019  . History of transient ischemic attack (TIA) 09/08/2019  . History of breast cancer 09/08/2019  . Near syncope 04/22/2013  . HTN (hypertension) 04/22/2013  . HLD (hyperlipidemia) 04/22/2013  . Palpitations 04/22/2013   PCP:  System, Provider Not In Pharmacy:   [facility] - [place], [place] - [address] [place] [address] Phone: [phone] Fax: [phone]  [id number] - [place], [place] - [address] [facility] [address] [facility]  [place] [address] Phone: [phone] Fax: [phone]     Social Determinants of Health (SDOH) Interventions    Readmission Risk Interventions No flowsheet data found.

## 2019-09-10 NOTE — Progress Notes (Signed)
Pharmacy Note   Per order on 3%NaCl infusion order, ** NOTE** dc this order when serum Na+ is 128 or greater  Na level at 1337 was 128, so will discontinue order.   Also per Na lab order, change scheduled Na lab draw to q8h x 3. Therefore will adjust.   Adalberto Cole, PharmD, BCPS 09/10/2019 2:32 PM

## 2019-09-10 NOTE — Progress Notes (Signed)
[facility] Progress Note  Subjective: per the family member pt's mentating is sig better today. Na up to 125  Vitals:   09/10/19 0447 09/10/19 0700 09/10/19 0708 09/10/19 0800  BP:    (!) 168/74  Pulse:    72  Resp:  (!) 26  20  Temp:   97.9 F (36.6 C)   TempSrc:   Oral   SpO2: 97%   98%  Weight:      Height:        Exam: Appears calm and comfortable  Respiratory system: Clear to auscultation. Cardiovascular system: S1 & S2 heard, RRR.  No pedal edema. Gastrointestinal system: Abdomen is nondistended, soft and nontender Central nervous system: Alert awake and oriented to herself and hospital.  Not oriented to time month or year. No focal neurological deficits. Extremities: Symmetric 5 x 5 power. Skin: No rashes, lesions or ulcers Psychiatry: Confused   Summary: Jeanette Arroyo is a 86 y.o. female with history of TIA, hypertension, right bundle branch block, asthma, osteoarthritis, acid reflux, breast cancer status post chemotherapy, anxiety depression recently started on Lexapro about 3 weeks ago admitted with dizziness, change in mental status and fatigue, seen as a consultation for the management of hyponatremia.   Assessment/ Plan: 1. 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  No evidence of GI loss or decreased oral intake.Urine sodium 127, urine osmolality 432 - Cortisol and TSH level unremarkable - Sodium level did not improve with fluid restriction and salt tablet - Pt remained symptomatic then rec'd 3% hypertonic saline and this am Na+ is up to 125.  Will continue another 6-8 hrs then dc, see orders.  - will cont fluid restriction post - 3% and see how she does - will dc her ARB as well as these may affect recovery from siadh - DC lexapro and avoid SSRI's in this patient  2. Hypertension: Blood pressure mildly elevated.  Volume status acceptable.  Continue home medication. Avoid acei/ ARB w/ SIADH for now,  have dc'd losartan and substituted w/ norvasc 5 bid.  3. Dizziness, fatigue and change in mental status probably because of hyponatremia: Management as above.  Doing better today.  4. History of breast cancer status post completion of chemotherapy in 2020     Rob Camyah Pultz 09/10/2019, 10:33 AM   Recent Labs  Lab 09/08/19 0833 09/08/19 1326 09/09/19 0130 09/09/19 1108 09/09/19 1745 09/10/19 0232  K 4.3   < > 4.4   < > 4.4 4.4  BUN 7*   < > 10   < > 14 12  CREATININE 0.59   < > 0.53   < > 0.66 0.68  CALCIUM 8.4*   < > 8.1*   < > 8.1* 8.2*  HGB 14.6  --  12.1  --   --   --    < > = values in this interval not displayed.   Inpatient medications: . Chlorhexidine Gluconate Cloth  6 each Topical Daily  . cholecalciferol  2,000 Units Oral Daily  . clopidogrel  75 mg Oral QHS  . diltiazem  30 mg Oral Q6H  . enoxaparin (LOVENOX) injection  40 mg Subcutaneous Q24H  . losartan  50 mg Oral QHS  . pantoprazole  40 mg Oral BID  . sodium chloride flush  3 mL Intravenous Once   . sodium chloride (hypertonic) 35 mL/hr at 09/10/19 0015   acetaminophen **OR** acetaminophen, ALPRAZolam, nitroGLYCERIN, ondansetron **OR** ondansetron (ZOFRAN) IV

## 2019-09-10 NOTE — Evaluation (Signed)
Occupational Therapy Evaluation Patient Details Name: Jeanette Arroyo MRN: [id number] DOB: [birth year] Today's Date: 09/10/2019    History of Present Illness 86 y.o. female with medical history significant of TIA, chronic right BBB, asthma, breast cancer s/p completion of chemotherapy in 2020 who presented with dizziness and fatigue and admitted with hyponatremia   Clinical Impression   Jeanette Arroyo is a 86 year old woman admitted to hospital with hyponatremia. On evaluation patient's sodium had improved to 128. Patient demonstrates baseline ROM and strength of upper extremities (bilateral decreased shoulder range of motion from rotator cuff injuries), functional mobility and ADLs. Patient appears to be at her baseline and patient's daughter present to confirm. No OT needs at this time.    Follow Up Recommendations  No OT follow up    Equipment Recommendations  None recommended by OT    Recommendations for Other Services       Precautions / Restrictions Precautions Precautions: Fall      Mobility Bed Mobility Overal bed mobility: Needs Assistance Bed Mobility: Supine to Sit;Sit to Supine     Supine to sit: Supervision;HOB elevated Sit to supine: Supervision;HOB elevated   General bed mobility comments: increased time and effort however no assist required  Transfers Overall transfer level: Needs assistance Equipment used: Rolling walker (2 wheeled) Transfers: Sit to/from Stand Sit to Stand: Min guard         General transfer comment: min/guard for safety    Balance Overall balance assessment: No apparent balance deficits (not formally assessed)         Standing balance support: No upper extremity supported Standing balance-Leahy Scale: Fair Standing balance comment: able to stand statically without support, uses RW for mobility                           ADL either performed or assessed with clinical judgement   ADL Overall ADL's : Modified  independent                                       General ADL Comments: Patient modified independent with use of of grab bar and RW to perform toileting, standing at sink to perform grooming. Therapist assisted only with lines/leads.     Vision   Vision Assessment?: No apparent visual deficits     Perception     Praxis      Pertinent Vitals/Pain Pain Assessment: Faces Faces Pain Scale: Hurts little more Pain Location: bil knees Pain Descriptors / Indicators: Aching Pain Intervention(s): Repositioned;Monitored during session     Hand Dominance Right   Extremity/Trunk Assessment Upper Extremity Assessment Upper Extremity Assessment: Overall WFL for tasks assessed;RUE deficits/detail;LUE deficits/detail RUE Deficits / Details: decreased shoulder ROM from rotator cuff injury LUE Deficits / Details: decreased shoulder ROM from rotator cuff injury   Lower Extremity Assessment Lower Extremity Assessment: Defer to PT evaluation   Cervical / Trunk Assessment Cervical / Trunk Assessment: Normal   Communication Communication Communication: No difficulties   Cognition Arousal/Alertness: Awake/alert Behavior During Therapy: WFL for tasks assessed/performed Overall Cognitive Status: Within Functional Limits for tasks assessed                                     General Comments       Exercises  Shoulder Instructions      Home Living Family/patient expects to be discharged to:: Private residence Living Arrangements: Children;Other relatives Available Help at Discharge: Family Type of Home: House Home Access: Stairs to enter;Ramped entrance Entrance Stairs-Number of Steps: 5 Entrance Stairs-Rails: Left Home Layout: One level     Bathroom Shower/Tub: Producer, television/film/video: Standard Bathroom Accessibility: Yes How Accessible: Accessible via walker Home Equipment: Shower seat;Walker - 2 wheels          Prior  Functioning/Environment Level of Independence: Independent with assistive device(s)        Comments: Ambulates with walker and independent with ADLs. Family reports patient receving HH PT for knee pain. Patient lives in [place] with her son with a ramped entrance. Visits her daughter in [place] - with 5 steps and a rail.        OT Problem List:        OT Treatment/Interventions:      OT Goals(Current goals can be found in the care plan section) Acute Rehab OT Goals OT Goal Formulation: All assessment and education complete, DC therapy  OT Frequency:     Barriers to D/C:            Co-evaluation PT/OT/SLP Co-Evaluation/Treatment: Yes Reason for Co-Treatment: To address functional/ADL transfers (co-eval, for potential behaviors secondary to confusion from hyponatremia) PT goals addressed during session: Mobility/safety with mobility OT goals addressed during session: ADL's and self-care      AM-PAC OT "6 Clicks" Daily Activity     Outcome Measure Help from another person eating meals?: None Help from another person taking care of personal grooming?: None Help from another person toileting, which includes using toliet, bedpan, or urinal?: None Help from another person bathing (including washing, rinsing, drying)?: None Help from another person to put on and taking off regular upper body clothing?: None Help from another person to put on and taking off regular lower body clothing?: None 6 Click Score: 24   End of Session Equipment Utilized During Treatment: Gait belt;Rolling walker Nurse Communication: Mobility status  Activity Tolerance: Patient tolerated treatment well Patient left: in bed;with call bell/phone within reach;with family/visitor present;with bed alarm set  OT Visit Diagnosis: Unsteadiness on feet (R26.81)                Time: 6568-1275 OT Time Calculation (min): 20 min Charges:  OT General Charges $OT Visit: 1 Visit OT Evaluation $OT Eval Low  Complexity: 1 Low  Vianna Venezia, OTR/L Acute Care Rehab Services  Office [phone] Pager: [phone]   Kelli Churn 09/10/2019, 4:39 PM

## 2019-09-10 NOTE — Evaluation (Signed)
Physical Therapy Evaluation Patient Details Name: Jeanette Arroyo MRN: [id number] DOB: 08/27/1920 Today's Date: 09/10/2019   History of Present Illness  86 y.o. female with medical history significant of TIA, chronic right BBB, asthma, breast cancer s/p completion of chemotherapy in 2020 who presented with dizziness and fatigue and admitted with hyponatremia  Clinical Impression  Patient evaluated by Physical Therapy with no further acute PT needs identified. All education has been completed and the patient has no further questions.  Pt mobilizing well with RW and encouraged to ambulate with nursing staff during acute stay.  Pt plans to d/c home and had been receiving HHPT prior to admission.  Pt would benefit from resuming HHPT services. PT is signing off. Thank you for this referral.     Follow Up Recommendations Home health PT (resume HHPT in [place])    Equipment Recommendations  None recommended by PT    Recommendations for Other Services       Precautions / Restrictions Precautions Precautions: Fall      Mobility  Bed Mobility Overal bed mobility: Needs Assistance Bed Mobility: Supine to Sit;Sit to Supine     Supine to sit: Supervision;HOB elevated Sit to supine: Supervision;HOB elevated   General bed mobility comments: increased time and effort however no assist required  Transfers Overall transfer level: Needs assistance Equipment used: Rolling walker (2 wheeled) Transfers: Sit to/from Stand Sit to Stand: Min guard         General transfer comment: min/guard for safety  Ambulation/Gait Ambulation/Gait assistance: Min guard Gait Distance (Feet): 200 Feet Assistive device: Rolling walker (2 wheeled) Gait Pattern/deviations: Step-through pattern;Decreased stride length     General Gait Details: steady with RW, reports mild dizziness which did not worsen, BP upon return to bed 143/80 mmHg  Stairs            Wheelchair Mobility    Modified Rankin  (Stroke Patients Only)       Balance Overall balance assessment: Needs assistance         Standing balance support: No upper extremity supported Standing balance-Leahy Scale: Fair Standing balance comment: able to stand statically without support, uses RW for mobility                             Pertinent Vitals/Pain Pain Assessment: Faces Faces Pain Scale: Hurts little more Pain Location: bil knees Pain Descriptors / Indicators: Aching Pain Intervention(s): Repositioned;Monitored during session    Home Living Family/patient expects to be discharged to:: Private residence Living Arrangements: Children;Other relatives Available Help at Discharge: Family Type of Home: House Home Access: Stairs to enter;Ramped entrance Entrance Stairs-Rails: Left Entrance Stairs-Number of Steps: 5 Home Layout: One level Home Equipment: Clinical cytogeneticist - 2 wheels      Prior Function Level of Independence: Independent with assistive device(s)         Comments: Ambulates with walker and independent with ADLs. Family reports patient receving HH PT for knee pain. Patient lives in [place] with her son with a ramped entrance. Visits her daughter in [place] - with 5 steps and a rail.     Hand Dominance   Dominant Hand: Right    Extremity/Trunk Assessment        Lower Extremity Assessment Lower Extremity Assessment: Generalized weakness       Communication   Communication: No difficulties  Cognition Arousal/Alertness: Awake/alert Behavior During Therapy: WFL for tasks assessed/performed Overall Cognitive Status: Within Functional Limits for tasks assessed  General Comments      Exercises     Assessment/Plan    PT Assessment All further PT needs can be met in the next venue of care  PT Problem List Decreased strength;Decreased mobility;Decreased knowledge of use of DME;Decreased activity  tolerance;Pain       PT Treatment Interventions      PT Goals (Current goals can be found in the Care Plan section)  Acute Rehab PT Goals PT Goal Formulation: All assessment and education complete, DC therapy    Frequency     Barriers to discharge        Co-evaluation PT/OT/SLP Co-Evaluation/Treatment: Yes Reason for Co-Treatment: To address functional/ADL transfers PT goals addressed during session: Mobility/safety with mobility OT goals addressed during session: ADL's and self-care       AM-PAC PT "6 Clicks" Mobility  Outcome Measure Help needed turning from your back to your side while in a flat bed without using bedrails?: None Help needed moving from lying on your back to sitting on the side of a flat bed without using bedrails?: None Help needed moving to and from a bed to a chair (including a wheelchair)?: None Help needed standing up from a chair using your arms (e.g., wheelchair or bedside chair)?: A Little Help needed to walk in hospital room?: A Little Help needed climbing 3-5 steps with a railing? : A Little 6 Click Score: 21    End of Session Equipment Utilized During Treatment: Gait belt Activity Tolerance: Patient tolerated treatment well Patient left: with call bell/phone within reach;in bed;with family/visitor present Nurse Communication: Mobility status PT Visit Diagnosis: Difficulty in walking, not elsewhere classified (R26.2)    Time: 8016-5537 PT Time Calculation (min) (ACUTE ONLY): 23 min   Charges:   PT Evaluation $PT Eval Low Complexity: 1 Low        Kati PT, DPT Acute Rehabilitation Services Pager: [phone] Office: [phone]  York Ram E 09/10/2019, 3:59 PM

## 2019-09-11 LAB — BASIC METABOLIC PANEL
Anion gap: 8 (ref 5–15)
BUN: 12 mg/dL (ref 8–23)
CO2: 23 mmol/L (ref 22–32)
Calcium: 8.4 mg/dL — ABNORMAL LOW (ref 8.9–10.3)
Chloride: 90 mmol/L — ABNORMAL LOW (ref 98–111)
Creatinine, Ser: 0.55 mg/dL (ref 0.44–1.00)
GFR calc Af Amer: >60 mL/min (ref >60)
GFR calc non Af Amer: >60 mL/min (ref >60)
Glucose, Bld: 103 mg/dL — ABNORMAL HIGH (ref 70–99)
Potassium: 3.8 mmol/L (ref 3.5–5.1)
Sodium: 121 mmol/L — ABNORMAL LOW (ref 135–145)

## 2019-09-11 LAB — SODIUM: Sodium: 128 mmol/L — ABNORMAL LOW (ref 135–145)

## 2019-09-11 LAB — OSMOLALITY: Osmolality: 267 mOsm/kg — ABNORMAL LOW (ref 275–295)

## 2019-09-11 MED ORDER — FUROSEMIDE 20 MG PO TABS
10.0000 mg | ORAL_TABLET | Freq: Two times a day (BID) | ORAL | Status: DC
  Administered 2019-09-11: 10 mg via ORAL
  Filled 2019-09-11: qty 1

## 2019-09-11 MED ORDER — FUROSEMIDE 20 MG PO TABS
10.0000 mg | ORAL_TABLET | Freq: Two times a day (BID) | ORAL | Status: DC
  Administered 2019-09-12 – 2019-09-13 (×3): 10 mg via ORAL
  Filled 2019-09-11 (×3): qty 1

## 2019-09-11 MED ORDER — SODIUM CHLORIDE 1 G PO TABS
2.0000 g | ORAL_TABLET | Freq: Three times a day (TID) | ORAL | Status: DC
  Administered 2019-09-12 (×3): 2 g via ORAL
  Filled 2019-09-11 (×4): qty 2

## 2019-09-11 MED ORDER — SODIUM CHLORIDE 1 G PO TABS
2.0000 g | ORAL_TABLET | Freq: Three times a day (TID) | ORAL | Status: DC
  Administered 2019-09-11 (×2): 2 g via ORAL
  Filled 2019-09-11 (×2): qty 2

## 2019-09-11 MED ORDER — TOLVAPTAN 15 MG PO TABS
15.0000 mg | ORAL_TABLET | ORAL | Status: AC
  Administered 2019-09-11: 15 mg via ORAL
  Filled 2019-09-11: qty 1

## 2019-09-11 MED ORDER — SODIUM CHLORIDE 1 G PO TABS: 2.0000 g | ORAL_TABLET | Freq: Once | ORAL | Status: AC

## 2019-09-11 NOTE — Progress Notes (Signed)
[facility] Progress Note  Subjective: Na + went up to 126 then dropped to 123 and then 121 this am. Pt not feeling well today. Nonspecific.   Vitals:   09/11/19 1043 09/11/19 1100 09/11/19 1200 09/11/19 1221  BP: (!) 164/74   137/69  Pulse:  83 81   Resp:  (!) 23 (!) 24   Temp:      TempSrc:      SpO2:  99% 97%   Weight:      Height:        Exam: Appears calm and comfortable  Respiratory system: Clear to auscultation. Cardiovascular system: S1 & S2 heard, RRR.  No pedal edema. Gastrointestinal system: Abdomen is nondistended, soft and nontender Central nervous system: Alert awake and oriented to herself and hospital.  Not oriented to time month or year. No focal neurological deficits. Extremities: Symmetric 5 x 5 power. Skin: No rashes, lesions or ulcers Psychiatry: Confused    Home meds:  - cardizem 30 prn/ losartan 50 hs/ sl ntg prn/ plavix 75  - xanax 0.125 prn/ lexapro 5mg  hs  - protonix 40 bid  - prn's/ vitamins/ supplements    Summary: Jeanette Arroyo is a 86 y.o. female with history of TIA, hypertension, right bundle branch block, asthma, osteoarthritis, acid reflux, breast cancer status post chemotherapy, anxiety depression recently started on Lexapro about 3 weeks ago admitted with dizziness, change in mental status and fatigue, seen as a consultation for the management of hyponatremia.   Assessment/ Plan: 1. 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  No evidence of GI loss or decreased oral intake.Urine sodium 127, urine osmolality 432 - Cortisol and TSH level unremarkable  - dc'd her ARB as well as these may affect recovery from siadh - dc'd lexapro and avoid SSRI's in this patient - Sodium level did not improve with fluid restriction/ salt tabs and pt remained symptomatic > so she was given 3% hypertonic saline and Na+ improved to 125 so 3% dc'd  yesterday > unfortunately Na+ dropped again now to 121 this am  - will plan tolvaptan 15 mg x 1, serial Na+ every 4-6 hrs  2. Hypertension: Blood pressure mildly elevated.  Volume status acceptable.  Continue home medication. Avoid acei/ ARB w/ SIADH for now, have dc'd losartan and substituted w/ norvasc 5 bid.  3. Dizziness/ fatigue: suspect this is related to #1. CT head was negative 4. H/o breast cancer: sp chemoRx  Rob Shaina Gullatt 09/11/2019, 12:30 PM   Recent Labs  Lab 09/08/19 0833 09/08/19 1326 09/09/19 0130 09/09/19 1108 09/10/19 0232 09/11/19 0705  K 4.3   < > 4.4   < > 4.4 3.8  BUN 7*   < > 10   < > 12 12  CREATININE 0.59   < > 0.53   < > 0.68 0.55  CALCIUM 8.4*   < > 8.1*   < > 8.2* 8.4*  HGB 14.6  --  12.1  --   --   --    < > = values in this interval not displayed.   Inpatient medications: . amLODipine  5 mg Oral BID  . Chlorhexidine Gluconate Cloth  6 each Topical Daily  . cholecalciferol  2,000 Units Oral Daily  . clopidogrel  75 mg Oral QHS  . diltiazem  30 mg Oral Q6H  . enoxaparin (LOVENOX) injection  40 mg Subcutaneous Q24H  . [  START ON 09/12/2019] furosemide  10 mg Oral BID  . pantoprazole  40 mg Oral BID  . sodium chloride flush  3 mL Intravenous Once  . [START ON 09/12/2019] sodium chloride  2 g Oral TID WC  . tolvaptan  15 mg Oral Q24H    acetaminophen **OR** acetaminophen, ALPRAZolam, nitroGLYCERIN, ondansetron **OR** ondansetron (ZOFRAN) IV

## 2019-09-11 NOTE — Progress Notes (Signed)
PROGRESS NOTE  Jeanette SnowballKathryn Arroyo  ZOX:[id number] DOB: 04-20-1920 DOA: 09/08/2019 PCP: Dr. Alease FrameJennifer Szurgot Brief Narrative: Per HPI: Jeanette Arroyo is a 86 y.o. female with medical history significant of TIA, chronic right BBB, asthma, breast cancer s/p completion of chemotherapy in 2020 who presented with dizziness and fatigue.  Patient is a poor historian and H&P was obtained by chart review and talking to patient and her daughter at bedside  [place], and she recently moves in with her daughter a few days ago.   Patient reports that she has had intermittent lightheadedness, dizziness and fatigue for a few days.  Associated symptom included mild nausea, "acid reflux" symptoms and generalized weakness.  She apparently has had intermittently high and low blood pressures for a few weeks. Her home diltiazem 300 mg was changed to 30 mg as needed, and losartan was added to her regimen by her PCP.  She continued to have elevated SBP of 190's at home and noted to have " irregular rhythm" on home BP monitor. Per daughter, Jeanette BosLexapro was added for anxiety about 3 weeks ago, and Protonix was added about 1 week ago.  This morning patient woke up feeling dizziness, lightheadedness with one near syncope episode.  Denies syncope , loss of consciousness, headaches, blurry vision, focal numbness/weakness, seizures or confusion.  Patient came to the emergency room for further evaluation.  In the emergency room, she was afebrile with pulse 89, RR 19, BP 159/83 and room air O2 sats 99%.  The labs showed sodium 117, chloride 83, creatinine 0.59, negative troponin, negative proBNP, nonrevealing CBC, negative UA.  EKG showed NSR with RBBB. CXR showed no acute changes.  Patient received saline 500 cc bolus at the ED.  Assessment & Plan: Active Problems:   Near syncope   HTN (hypertension)   HLD (hyperlipidemia)   Hyponatremia   Dizziness   Uncontrolled hypertension    History of right bundle branch block (RBBB)   Asthma, chronic, unspecified asthma severity, with acute exacerbation   History of transient ischemic attack (TIA)   History of breast cancer  Hyponatremia, remains euvolemic: Suspected SIADH due to SSRI recently started, possibly with inadequate solute intake contributing. Cortisol and TSH ok.  - Worsening: Na trending back down 128 >> 121 after discontinuing hypertonic saline. - Enforce 1000ml fluid restriction - Tolvaptan per nephrology. No LFT elevations noted on admission, will monitor in AM - Holding SSRI, added to allergy list. Escitalopram t1/2 17-29hrs with anticipated increase by 50% in elderly patients, last dose 7/6 PM. - Will recheck serum, urine osmolality for consideration of loop diuretic.   Anxiety:  - Stopped lexapro. Given relatively longer half-life and short duration of therapy, do not need to taper, but will monitor for effects.  - Discussed initiation of buspar, but will hold off for now. Continuing very low dose po xanax prn - Requested unrestricted visitation status to minimize effects of delirium, stable.  Hypocalcemia:  - Supplemented. Note albumin was wnl. QTc on cardiac monitoring currently 430 msec on my personal review today. - Continue vitamin D supplement  History of TIA:  - Continue plavix, statin.   HTN:  - Diltiazem 30mg  q6h to be continued. BP under reasonable control without hypotension. Has had isolated systolic hypertension which improved with norvasc. Continue norvasc.  - Holding ARB.  Acute metabolic encephalopathy: Likely related to hyponatremia as this has improved. Query element of pseudodementia with concurrent mood disorder.  - Delirium precautions, minimize room changes. -  Would benefit from outpatient neuropsychiatry evaluation.   Triple negative right breast cancer s/p lumpectomy then mastectomy with recurrence, salvage XRT completed Nov 2020. Most recent note reports NED. - Continue  oncology follow up as outpatient.   Melanoma s/p TVEC overseen by [facility].   PACs, chronic RBBB: Noted.  - Continue telemetry.   GERD:  - Protonix daily with plans to continue work up as outpatient.  DVT prophylaxis: Lovenox 40mg  q24h (BMI 27, CrCl remains >97ml/min) Code Status: DNR confirmed Family Communication: Daughter at bedside this AM  Disposition Plan:  Status is: Inpatient  Remains inpatient appropriate because:Persistent severe electrolyte disturbances. If Na rising after tolvaptan (next check at 5pm), can transfer to floor.  Dispo: The patient is from: Home              Anticipated d/c is to: Home              Anticipated d/c date is: 2 days              Patient currently is not medically stable to d/c.  Consultants:   Nephrology 7/8  Procedures:   Hypertonic saline 7/8 - 7/9  Antimicrobials:  None   Subjective: Feels fatigued but no new confusion overnight. Slept somewhat poorly. There are no windows in her room w/HEPA filter in place. Eating modest amounts, but drinking water. No complaints of pain.   Objective: Vitals:   09/11/19 1043 09/11/19 1100 09/11/19 1200 09/11/19 1221  BP: (!) 164/74   137/69  Pulse:  83 81   Resp:  (!) 23 (!) 24   Temp:      TempSrc:      SpO2:  99% 97%   Weight:      Height:        Intake/Output Summary (Last 24 hours) at 09/11/2019 1402 Last data filed at 09/11/2019 0500 Gross per 24 hour  Intake 1090.19 ml  Output --  Net 1090.19 ml   Filed Weights   09/08/19 1110 09/09/19 1000  Weight: 66.7 kg 66.4 kg   Gen: Elderly tired-appearing female in no distress Pulm: Nonlabored breathing room air. Clear bilaterally. CV: Regular rate and rhythm. No murmur, rub, or gallop. No JVD, minimal dependent edema. GI: Abdomen soft, non-tender, non-distended, with normoactive bowel sounds.  Ext: Warm, no deformities Skin: No new rashes, lesions or ulcers on visualized skin. Dense SebK's that are chronic. Neuro: Alert and  interactive, slowed but intact cognition. HOH with symmetric grip and EHL strength bilaterally without focal neurological deficits. Psych: Judgement and insight appear fair. Mood euthymic & affect congruent. Behavior is appropriate.    Data Reviewed: I have personally reviewed following labs and imaging studies  CBC: Recent Labs  Lab 09/08/19 0833 09/09/19 0130  WBC 5.7 5.0  NEUTROABS  --  3.7  HGB 14.6 12.1  HCT 40.8 34.3*  MCV 87.0 87.1  PLT 320 273   Basic Metabolic Panel: Recent Labs  Lab 09/09/19 1108 09/09/19 1108 09/09/19 1427 09/09/19 1427 09/09/19 1745 09/09/19 2320 09/10/19 0232 09/10/19 1008 09/10/19 1337 09/10/19 2157 09/11/19 0705  NA 121*   < > 122*   < > 121*   < > 125* 126* 128* 126* 121*  K 4.2  --  4.3  --  4.4  --  4.4  --   --   --  3.8  CL 89*  --  90*  --  90*  --  93*  --   --   --  90*  CO2 20*  --  23  --  22  --  24  --   --   --  23  GLUCOSE 118*  --  101*  --  103*  --  101*  --   --   --  103*  BUN 8  --  11  --  14  --  12  --   --   --  12  CREATININE 0.50  --  0.56  --  0.66  --  0.68  --   --   --  0.55  CALCIUM 8.2*  --  8.0*  --  8.1*  --  8.2*  --   --   --  8.4*   < > = values in this interval not displayed.   GFR: Estimated Creatinine Clearance: 35.8 mL/min (by C-G formula based on SCr of 0.55 mg/dL). Liver Function Tests: Recent Labs  Lab 09/08/19 0833  AST 19  ALT 18  ALKPHOS 65  BILITOT 0.7  PROT 7.8  ALBUMIN 4.1   No results for input(s): LIPASE, AMYLASE in the last 168 hours. No results for input(s): AMMONIA in the last 168 hours. Coagulation Profile: No results for input(s): INR, PROTIME in the last 168 hours. Cardiac Enzymes: No results for input(s): CKTOTAL, CKMB, CKMBINDEX, TROPONINI in the last 168 hours. BNP (last 3 results) No results for input(s): PROBNP in the last 8760 hours. HbA1C: Recent Labs    09/09/19 0130  HGBA1C 5.5   CBG: No results for input(s): GLUCAP in the last 168 hours. Lipid  Profile: No results for input(s): CHOL, HDL, LDLCALC, TRIG, CHOLHDL, LDLDIRECT in the last 72 hours. Thyroid Function Tests: No results for input(s): TSH, T4TOTAL, FREET4, T3FREE, THYROIDAB in the last 72 hours. Anemia Panel: No results for input(s): VITAMINB12, FOLATE, FERRITIN, TIBC, IRON, RETICCTPCT in the last 72 hours. Urine analysis:    Component Value Date/Time   COLORURINE YELLOW 09/08/2019 0950   APPEARANCEUR HAZY (A) 09/08/2019 0950   LABSPEC 1.011 09/08/2019 0950   PHURINE 7.0 09/08/2019 0950   GLUCOSEU 50 (A) 09/08/2019 0950   HGBUR NEGATIVE 09/08/2019 0950   BILIRUBINUR NEGATIVE 09/08/2019 0950   KETONESUR NEGATIVE 09/08/2019 0950   PROTEINUR 30 (A) 09/08/2019 0950   UROBILINOGEN 0.2 04/22/2013 2125   NITRITE NEGATIVE 09/08/2019 0950   LEUKOCYTESUR TRACE (A) 09/08/2019 0950   Recent Results (from the past 240 hour(s))  SARS Coronavirus 2 by RT PCR (hospital order, performed in [facility] hospital lab) Nasopharyngeal Nasopharyngeal Swab     Status: None   Collection Time: 09/08/19 10:53 AM   Specimen: Nasopharyngeal Swab  Result Value Ref Range Status   SARS Coronavirus 2 NEGATIVE NEGATIVE Final    Comment: (NOTE) SARS-CoV-2 target nucleic acids are NOT DETECTED.  The SARS-CoV-2 RNA is generally detectable in upper and lower respiratory specimens during the acute phase of infection. The lowest concentration of SARS-CoV-2 viral copies this assay can detect is 250 copies / mL. A negative result does not preclude SARS-CoV-2 infection and should not be used as the sole basis for treatment or other patient management decisions.  A negative result may occur with improper specimen collection / handling, submission of specimen other than nasopharyngeal swab, presence of viral mutation(s) within the areas targeted by this assay, and inadequate number of viral copies (<250 copies / mL). A negative result must be combined with clinical observations, patient history, and  epidemiological information.  Fact Sheet for Patients:   BoilerBrush.com.cy  Fact Sheet  for Healthcare Providers: https://pope.com/  This test is not yet approved or  cleared by the [place] and has been authorized for detection and/or diagnosis of SARS-CoV-2 by FDA under an Emergency Use Authorization (EUA).  This EUA will remain in effect (meaning this test can be used) for the duration of the COVID-19 declaration under Section 564(b)(1) of the Act, 21 U.S.C. section 360bbb-3(b)(1), unless the authorization is terminated or revoked sooner.  Performed at [facility], [address] [place], [place] [address]   MRSA PCR Screening     Status: None   Collection Time: 09/09/19  9:39 AM   Specimen: Nasal Mucosa; Nasopharyngeal  Result Value Ref Range Status   MRSA by PCR NEGATIVE NEGATIVE Final    Comment:        The GeneXpert MRSA Assay (FDA approved for NASAL specimens only), is one component of a comprehensive MRSA colonization surveillance program. It is not intended to diagnose MRSA infection nor to guide or monitor treatment for MRSA infections. Performed at [facility], [address] [place], [place] [address]       Radiology Studies: No results found.  Scheduled Meds: . amLODipine  5 mg Oral BID  . Chlorhexidine Gluconate Cloth  6 each Topical Daily  . cholecalciferol  2,000 Units Oral Daily  . clopidogrel  75 mg Oral QHS  . diltiazem  30 mg Oral Q6H  . enoxaparin (LOVENOX) injection  40 mg Subcutaneous Q24H  . [START ON 09/12/2019] furosemide  10 mg Oral BID  . pantoprazole  40 mg Oral BID  . [START ON 09/12/2019] sodium chloride  2 g Oral TID WC   Continuous Infusions:    LOS: 2 days   Time spent: 35 minutes.  Tyrone Nine, MD [facility] [email or web address] 09/11/2019, 2:02 PM

## 2019-09-12 LAB — CBC
HCT: 38.5 % (ref 36.0–46.0)
Hemoglobin: 13.1 g/dL (ref 12.0–15.0)
MCH: 30.8 pg (ref 26.0–34.0)
MCHC: 34 g/dL (ref 30.0–36.0)
MCV: 90.4 fL (ref 80.0–100.0)
Platelets: 286 10*3/uL (ref 150–400)
RBC: 4.26 MIL/uL (ref 3.87–5.11)
RDW: 13.2 % (ref 11.5–15.5)
WBC: 5.3 10*3/uL (ref 4.0–10.5)
nRBC: 0 % (ref 0.0–0.2)

## 2019-09-12 LAB — BASIC METABOLIC PANEL
Anion gap: 12 (ref 5–15)
BUN: 14 mg/dL (ref 8–23)
CO2: 24 mmol/L (ref 22–32)
Calcium: 9.1 mg/dL (ref 8.9–10.3)
Chloride: 98 mmol/L (ref 98–111)
Creatinine, Ser: 0.68 mg/dL (ref 0.44–1.00)
GFR calc Af Amer: >60 mL/min (ref >60)
GFR calc non Af Amer: >60 mL/min (ref >60)
Glucose, Bld: 112 mg/dL — ABNORMAL HIGH (ref 70–99)
Potassium: 4 mmol/L (ref 3.5–5.1)
Sodium: 134 mmol/L — ABNORMAL LOW (ref 135–145)

## 2019-09-12 LAB — HEPATIC FUNCTION PANEL
ALT: 21 U/L (ref 0–44)
AST: 19 U/L (ref 15–41)
Albumin: 3.7 g/dL (ref 3.5–5.0)
Alkaline Phosphatase: 55 U/L (ref 38–126)
Bilirubin, Direct: 0.1 mg/dL (ref 0.0–0.2)
Indirect Bilirubin: 0.4 mg/dL (ref 0.3–0.9)
Total Bilirubin: 0.5 mg/dL (ref 0.3–1.2)
Total Protein: 7.1 g/dL (ref 6.5–8.1)

## 2019-09-12 LAB — SODIUM
Sodium: 127 mmol/L — ABNORMAL LOW (ref 135–145)
Sodium: 132 mmol/L — ABNORMAL LOW (ref 135–145)
Sodium: 133 mmol/L — ABNORMAL LOW (ref 135–145)
Sodium: 137 mmol/L (ref 135–145)

## 2019-09-12 LAB — OSMOLALITY, URINE: Osmolality, Ur: 298 mOsm/kg — ABNORMAL LOW (ref 300–900)

## 2019-09-12 NOTE — Progress Notes (Signed)
[facility] Progress Note  Subjective: took tolvaptan yest, Na 132 this am  Vitals:   09/12/19 0233 09/12/19 0410 09/12/19 0555 09/12/19 1004  BP: (!) 110/54  (!) 154/94 126/62  Pulse: 78  94 79  Resp: 20  20 18   Temp: 98 F (36.7 C)  97.6 F (36.4 C) 97.6 F (36.4 C)  TempSrc: Oral  Oral Oral  SpO2: 97%  100%   Weight: 65.6 kg 65.6 kg    Height:        Exam: Appears calm and comfortable  Respiratory system: Clear to auscultation. Cardiovascular system: S1 & S2 heard, RRR.  No pedal edema. Gastrointestinal system: Abdomen is nondistended, soft and nontender Central nervous system: Alert awake and oriented to herself and hospital.  Not oriented to time month or year. No focal neurological deficits. Extremities: Symmetric 5 x 5 power. Skin: No rashes, lesions or ulcers Psychiatry: Confused    Home meds:  - cardizem 30 prn/ losartan 50 hs/ sl ntg prn/ plavix 75  - xanax 0.125 prn/ lexapro 5mg  hs  - protonix 40 bid  - prn's/ vitamins/ supplements    Summary: Jeanette Arroyo is a 86 y.o. female with history of TIA, hypertension, right bundle branch block, asthma, osteoarthritis, acid reflux, breast cancer status post chemotherapy, anxiety depression recently started on Lexapro about 3 weeks ago admitted with dizziness, change in mental status and fatigue, seen as a consultation for the management of hyponatremia.   Assessment/ Plan: 1. 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  No evidence of GI loss or decreased oral intake.Urine sodium 127, urine osmolality 432 - Cortisol and TSH level unremarkable  - dc'd her ARB as well as these may affect recovery from siadh - dc'd lexapro and would avoid SSRI's / thiazides in this patient - Sodium responded to 3% saline but then got worse, so got tolvaptan x 1 - Na+ today 132, will watch about 24 hrs on low-dose lasix/ nacl  tabs and fluid restriction, hopefully can dc tomorrow for PCP f/u.   2. Hypertension: Blood pressure mildly elevated.  Volume status acceptable.  Continue home medication. Avoid acei/ ARB w/ SIADH for now, have dc'd losartan and substituted w/ norvasc 5 bid.  3. Dizziness/ fatigue: suspect this is related to #1. CT head was negative 4. H/o breast cancer: sp chemoRx  Rob Lavren Lewan 09/12/2019, 12:59 PM   Recent Labs  Lab 09/09/19 0130 09/09/19 1108 09/11/19 0705 09/12/19 0536  K 4.4   < > 3.8 4.0  BUN 10   < > 12 14  CREATININE 0.53   < > 0.55 0.68  CALCIUM 8.1*   < > 8.4* 9.1  HGB 12.1  --   --  13.1   < > = values in this interval not displayed.   Inpatient medications:  amLODipine  5 mg Oral BID   cholecalciferol  2,000 Units Oral Daily   clopidogrel  75 mg Oral QHS   diltiazem  30 mg Oral Q6H   enoxaparin (LOVENOX) injection  40 mg Subcutaneous Q24H   furosemide  10 mg Oral BID   pantoprazole  40 mg Oral BID   sodium chloride  2 g Oral TID WC    acetaminophen **OR** acetaminophen, ALPRAZolam, nitroGLYCERIN, ondansetron **OR** ondansetron (ZOFRAN) IV

## 2019-09-12 NOTE — Progress Notes (Signed)
I agree with the previous Nurses's assessment.

## 2019-09-12 NOTE — Progress Notes (Signed)
PROGRESS NOTE  Jeanette Arroyo  WLN:[id number] DOB: 07/15/1920 DOA: 09/08/2019 PCP: Dr. Alease Frame Brief Narrative: Per HPI: Jeanette Arroyo is a 86 y.o. female with medical history significant of TIA, chronic right BBB, asthma, breast cancer s/p completion of chemotherapy in 2020, and anxiety recently started on lexapro who presented 7/7 with dizziness and fatigue. On initial evaluation she was afebrile with stable vital signs. Labs showed sodium 117, chloride 83, creatinine 0.59, negative troponin, negative proBNP, unremarkable CBC, negative UA. EKG showed NSR with RBBB. CXR showed no acute changes. Patient received saline 500 cc bolus at the ED and was admitted with fluid restriction and salt tabs. Sodium level did not improve and pt's confusion remained, so with suspicion of SSRI-induced SIADH, lexapro was held and the patient was transferred to ICU with nephrology consultant overseeing hypertonic saline administration on 7/8. Sodium improved with normalization of mentation but after discontinuation, began downtrending again. Tolvaptan was administered 7/10 with improvement in sodium levels and stability of mental status. The patient was transferred to the floor later that evening.   Assessment & Plan: Active Problems:   Near syncope   HTN (hypertension)   HLD (hyperlipidemia)   Hyponatremia   Dizziness   Uncontrolled hypertension   History of right bundle branch block (RBBB)   Asthma, chronic, unspecified asthma severity, with acute exacerbation   History of transient ischemic attack (TIA)   History of breast cancer  Symptomatic, severe hyponatremia: Suspected SIADH due to SSRI recently started, possibly with inadequate solute intake contributing. Appears euvolemic. Cortisol and TSH ok.  - Sustained improvement after one dose of tolvaptan on 7/10. Urine osmolality remains (though improved) above serum osmolality. Lasix given per nephrology. Will monitor another 24 hours. Will need local  follow up for monitoring before she returns to [place].  - fluid restriction, tolerating this well thus far. - LFTs remain wnl after tolvaptan. - Holding SSRI, added to allergy list. Escitalopram last dose 7/6 PM. - Will recheck serum, urine osmolality for consideration of loop diuretic.   Anxiety:  - Stopped lexapro. No evidence of withdrawal - PCP follow up advised. Continuing very low dose po xanax prn  Hypocalcemia: Normalized. Albumin remains within normal limits as well. QTc on cardiac monitoring currently 420 msec on my personal review today. - Continue vitamin D supplement  History of TIA:  - Continue plavix, statin.   HTN:  - Diltiazem 30mg  q6h to be continued. BP under reasonable control without hypotension. Has had isolated systolic hypertension which improved with norvasc. Continue norvasc.  - Holding ARB. Of course, avoid thiazide.  Acute metabolic encephalopathy: Likely related to hyponatremia as this has improved. Query element of pseudodementia with concurrent mood disorder.  - Delirium precautions.  - Could consider outpatient neuropsychiatry evaluation, though mentation has improved significantly.   Triple negative right breast cancer s/p lumpectomy then mastectomy with recurrence, salvage XRT completed Nov 2020. Most recent note reports NED. - Continue oncology follow up as outpatient.   Melanoma s/p TVEC overseen by [facility].   PACs, chronic RBBB: Noted.  - Continue telemetry.   GERD:  - Protonix daily with plans to continue work up as outpatient.  DVT prophylaxis: Lovenox 40mg  q24h  Code Status: DNR confirmed Family Communication: Daughter at bedside this PM Disposition Plan:  Status is: Inpatient  Remains inpatient appropriate because:Persistent severe electrolyte disturbances.  Dispo: The patient is from: Home              Anticipated d/c is to: Home  Anticipated d/c date is: 1 day              Patient currently is not medically  stable to d/c.  Consultants:   Nephrology 7/8  Procedures:   Hypertonic saline 7/8 - 7/9  Antimicrobials:  None   Subjective: No complaints. Denies pain anywhere. Has been eating fine. Daughter keeping record, as of about noon pt has had 360cc fluids. Denies thirst. No leg swelling or other complaints.   Objective: Vitals:   09/12/19 0410 09/12/19 0555 09/12/19 1004 09/12/19 1359  BP:  (!) 154/94 126/62 114/62  Pulse:  94 79 81  Resp:  20 18 18   Temp:  97.6 F (36.4 C) 97.6 F (36.4 C) 98.1 F (36.7 C)  TempSrc:  Oral Oral Oral  SpO2:  100%    Weight: 65.6 kg     Height:        Intake/Output Summary (Last 24 hours) at 09/12/2019 1546 Last data filed at 09/12/2019 0604 Gross per 24 hour  Intake 600 ml  Output 250 ml  Net 350 ml   Filed Weights   09/09/19 1000 09/12/19 0233 09/12/19 0410  Weight: 66.4 kg 65.6 kg 65.6 kg   Gen: Alert elderly female in no distress Pulm: Nonlabored breathing room air. Clear. CV: Regular rate and rhythm. No murmur, rub, or gallop. No JVD, no dependent edema. GI: Abdomen soft, non-tender, non-distended, with normoactive bowel sounds.  Ext: Warm, no deformities Skin: No new rashes, lesions or ulcers on visualized skin. SebK's as mentioned Neuro: Alert and oriented. No focal neurological deficits. Psych: Judgement and insight appear fair. Mood euthymic & affect congruent. Behavior is appropriate.    Data Reviewed: I have personally reviewed following labs and imaging studies  CBC: Recent Labs  Lab 09/08/19 0833 09/09/19 0130 09/12/19 0536  WBC 5.7 5.0 5.3  NEUTROABS  --  3.7  --   HGB 14.6 12.1 13.1  HCT 40.8 34.3* 38.5  MCV 87.0 87.1 90.4  PLT 320 273 286   Basic Metabolic Panel: Recent Labs  Lab 09/09/19 1427 09/09/19 1427 09/09/19 1745 09/09/19 2320 09/10/19 0232 09/10/19 1008 09/11/19 0705 09/11/19 1644 09/11/19 1648 09/12/19 0536 09/12/19 1155  NA 122*   < > 121*   < > 125*   < > 121* 128* 127* 134* 132*  K  4.3  --  4.4  --  4.4  --  3.8  --   --  4.0  --   CL 90*  --  90*  --  93*  --  90*  --   --  98  --   CO2 23  --  22  --  24  --  23  --   --  24  --   GLUCOSE 101*  --  103*  --  101*  --  103*  --   --  112*  --   BUN 11  --  14  --  12  --  12  --   --  14  --   CREATININE 0.56  --  0.66  --  0.68  --  0.55  --   --  0.68  --   CALCIUM 8.0*  --  8.1*  --  8.2*  --  8.4*  --   --  9.1  --    < > = values in this interval not displayed.   GFR: Estimated Creatinine Clearance: 35.6 mL/min (by C-G formula based on SCr of 0.68  mg/dL). Liver Function Tests: Recent Labs  Lab 09/08/19 0833 09/12/19 0536  AST 19 19  ALT 18 21  ALKPHOS 65 55  BILITOT 0.7 0.5  PROT 7.8 7.1  ALBUMIN 4.1 3.7   No results for input(s): LIPASE, AMYLASE in the last 168 hours. No results for input(s): AMMONIA in the last 168 hours. Coagulation Profile: No results for input(s): INR, PROTIME in the last 168 hours. Cardiac Enzymes: No results for input(s): CKTOTAL, CKMB, CKMBINDEX, TROPONINI in the last 168 hours. BNP (last 3 results) No results for input(s): PROBNP in the last 8760 hours. HbA1C: No results for input(s): HGBA1C in the last 72 hours. CBG: No results for input(s): GLUCAP in the last 168 hours. Lipid Profile: No results for input(s): CHOL, HDL, LDLCALC, TRIG, CHOLHDL, LDLDIRECT in the last 72 hours. Thyroid Function Tests: No results for input(s): TSH, T4TOTAL, FREET4, T3FREE, THYROIDAB in the last 72 hours. Anemia Panel: No results for input(s): VITAMINB12, FOLATE, FERRITIN, TIBC, IRON, RETICCTPCT in the last 72 hours. Urine analysis:    Component Value Date/Time   COLORURINE YELLOW 09/08/2019 0950   APPEARANCEUR HAZY (A) 09/08/2019 0950   LABSPEC 1.011 09/08/2019 0950   PHURINE 7.0 09/08/2019 0950   GLUCOSEU 50 (A) 09/08/2019 0950   HGBUR NEGATIVE 09/08/2019 0950   BILIRUBINUR NEGATIVE 09/08/2019 0950   KETONESUR NEGATIVE 09/08/2019 0950   PROTEINUR 30 (A) 09/08/2019 0950    UROBILINOGEN 0.2 04/22/2013 2125   NITRITE NEGATIVE 09/08/2019 0950   LEUKOCYTESUR TRACE (A) 09/08/2019 0950   Recent Results (from the past 240 hour(s))  SARS Coronavirus 2 by RT PCR (hospital order, performed in [facility] hospital lab) Nasopharyngeal Nasopharyngeal Swab     Status: None   Collection Time: 09/08/19 10:53 AM   Specimen: Nasopharyngeal Swab  Result Value Ref Range Status   SARS Coronavirus 2 NEGATIVE NEGATIVE Final    Comment: (NOTE) SARS-CoV-2 target nucleic acids are NOT DETECTED.  The SARS-CoV-2 RNA is generally detectable in upper and lower respiratory specimens during the acute phase of infection. The lowest concentration of SARS-CoV-2 viral copies this assay can detect is 250 copies / mL. A negative result does not preclude SARS-CoV-2 infection and should not be used as the sole basis for treatment or other patient management decisions.  A negative result may occur with improper specimen collection / handling, submission of specimen other than nasopharyngeal swab, presence of viral mutation(s) within the areas targeted by this assay, and inadequate number of viral copies (<250 copies / mL). A negative result must be combined with clinical observations, patient history, and epidemiological information.  Fact Sheet for Patients:   BoilerBrush.com.cyhttps://www.fda.gov/media/136312/download  Fact Sheet for Healthcare Providers: https://pope.com/https://www.fda.gov/media/136313/download  This test is not yet approved or  cleared by the [place] FDA and has been authorized for detection and/or diagnosis of SARS-CoV-2 by FDA under an Emergency Use Authorization (EUA).  This EUA will remain in effect (meaning this test can be used) for the duration of the COVID-19 declaration under Section 564(b)(1) of the Act, 21 U.S.C. section 360bbb-3(b)(1), unless the authorization is terminated or revoked sooner.  Performed at [facility], [address] [place], [place]  [id number]   MRSA PCR Screening     Status: None   Collection Time: 09/09/19  9:39 AM   Specimen: Nasal Mucosa; Nasopharyngeal  Result Value Ref Range Status   MRSA by PCR NEGATIVE NEGATIVE Final    Comment:        The GeneXpert MRSA Assay (FDA approved  for NASAL specimens only), is one component of a comprehensive MRSA colonization surveillance program. It is not intended to diagnose MRSA infection nor to guide or monitor treatment for MRSA infections. Performed at [facility], [address], [place], [place] [address]       Radiology Studies: No results found.  Scheduled Meds: . amLODipine  5 mg Oral BID  . cholecalciferol  2,000 Units Oral Daily  . clopidogrel  75 mg Oral QHS  . diltiazem  30 mg Oral Q6H  . enoxaparin (LOVENOX) injection  40 mg Subcutaneous Q24H  . furosemide  10 mg Oral BID  . pantoprazole  40 mg Oral BID  . sodium chloride  2 g Oral TID WC   Continuous Infusions:    LOS: 3 days   Time spent: 25 minutes.  Tyrone Nine, MD [facility] [email or web address] 09/12/2019, 3:46 PM

## 2019-09-13 LAB — BASIC METABOLIC PANEL
Anion gap: 11 (ref 5–15)
BUN: 18 mg/dL (ref 8–23)
CO2: 23 mmol/L (ref 22–32)
Calcium: 8.7 mg/dL — ABNORMAL LOW (ref 8.9–10.3)
Chloride: 99 mmol/L (ref 98–111)
Creatinine, Ser: 0.69 mg/dL (ref 0.44–1.00)
GFR calc Af Amer: >60 mL/min (ref >60)
GFR calc non Af Amer: >60 mL/min (ref >60)
Glucose, Bld: 114 mg/dL — ABNORMAL HIGH (ref 70–99)
Potassium: 3.9 mmol/L (ref 3.5–5.1)
Sodium: 133 mmol/L — ABNORMAL LOW (ref 135–145)

## 2019-09-13 MED ORDER — SODIUM CHLORIDE 1 G PO TABS
2.0000 g | ORAL_TABLET | Freq: Two times a day (BID) | ORAL | Status: DC
  Administered 2019-09-13: 2 g via ORAL

## 2019-09-13 MED ORDER — FUROSEMIDE 20 MG PO TABS: 10.0000 mg | ORAL_TABLET | Freq: Every day | ORAL | Status: DC

## 2019-09-13 MED ORDER — SODIUM CHLORIDE 1 G PO TABS: 2.0000 g | ORAL_TABLET | Freq: Two times a day (BID) | ORAL | 0 refills | Status: AC

## 2019-09-13 MED ORDER — AMLODIPINE BESYLATE 10 MG PO TABS: 5.0000 mg | ORAL_TABLET | Freq: Two times a day (BID) | ORAL | 0 refills | Status: AC

## 2019-09-13 MED ORDER — DILTIAZEM HCL 30 MG PO TABS: 30.0000 mg | ORAL_TABLET | Freq: Four times a day (QID) | ORAL | 0 refills | Status: AC

## 2019-09-13 MED ORDER — FUROSEMIDE 20 MG PO TABS: 10.0000 mg | ORAL_TABLET | Freq: Every day | ORAL | 0 refills | Status: AC

## 2019-09-13 NOTE — Progress Notes (Signed)
[facility] Progress Note  Subjective: Na 133- 137 overnight and this am  Vitals:   09/12/19 1359 09/12/19 1700 09/12/19 2214 09/13/19 0529  BP: 114/62 120/72 110/67   Pulse: 81 96 71 75  Resp: 18 18    Temp: 98.1 F (36.7 C) 97.6 F (36.4 C) 97.6 F (36.4 C) 97.8 F (36.6 C)  TempSrc: Oral   Oral  SpO2:   97% 97%  Weight:    67 kg  Height:        Exam: Appears calm and comfortable  Respiratory system: Clear to auscultation. Cardiovascular system: S1 & S2 heard, RRR.  No pedal edema. Gastrointestinal system: Abdomen is nondistended, soft and nontender Central nervous system: Alert awake and oriented to herself and hospital.  Not oriented to time month or year. No focal neurological deficits. Extremities: Symmetric 5 x 5 power. Skin: No rashes, lesions or ulcers Psychiatry: Confused    Home meds:  - cardizem 30 prn/ losartan 50 hs/ sl ntg prn/ plavix 75  - xanax 0.125 prn/ lexapro 5mg  hs  - protonix 40 bid  - prn's/ vitamins/ supplements    Summary: Jeanette Arroyo is a 86 y.o. female with history of TIA, hypertension, right bundle branch block, asthma, osteoarthritis, acid reflux, breast cancer status post chemotherapy, anxiety depression recently started on Lexapro about 3 weeks ago admitted with dizziness, change in mental status and fatigue, seen as a consultation for the management of hyponatremia.   Assessment/ Plan: 1. 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  No evidence of GI loss or decreased oral intake.Urine sodium 127, urine osmolality 432 - Cortisol and TSH level unremarkable  - dc'd her ARB as well as these may affect recovery from siadh - dc'd lexapro and would avoid SSRI's / thiazides in this patient - Sodium responded to 3% saline but then got worse, so got tolvaptan x 1 which was very helpful - ok for dc on NaCl 2 gm bid and lasix 10mg  qd  x 2 wks supply, fluid restrict 1000/ day and our office will contact the daughter to set up office f/u within a week. Will sign off.   2. Hypertension: Blood pressure mildly elevated.  Volume status acceptable.  Continue home medication. Avoid acei/ ARB w/ SIADH for now, dc'd losartan and substituted w/ norvasc 5 bid.  3. Dizziness/ fatigue: suspect this is related to #1. CT head was negative 4. H/o breast cancer: rx w/ chemorx, remote  Rob Koleen Celia 09/13/2019, 8:43 AM   Recent Labs  Lab 09/09/19 0130 09/09/19 1108 09/12/19 0536 09/13/19 0548  K 4.4   < > 4.0 3.9  BUN 10   < > 14 18  CREATININE 0.53   < > 0.68 0.69  CALCIUM 8.1*   < > 9.1 8.7*  HGB 12.1  --  13.1  --    < > = values in this interval not displayed.   Inpatient medications:  amLODipine  5 mg Oral BID   cholecalciferol  2,000 Units Oral Daily   clopidogrel  75 mg Oral QHS   diltiazem  30 mg Oral Q6H   enoxaparin (LOVENOX) injection  40 mg Subcutaneous Q24H   furosemide  10 mg Oral Daily   pantoprazole  40 mg Oral BID   sodium chloride  2 g Oral BID WC    acetaminophen **OR** acetaminophen, ALPRAZolam, nitroGLYCERIN, ondansetron **OR** ondansetron (ZOFRAN) IV

## 2019-09-13 NOTE — TOC Progression Note (Signed)
Transition of Care [facility]) - Progression Note    Patient Details  Name: Jeanette Arroyo MRN: [id number] Date of Birth: 1920-07-23  [facility]) CM/SW Contact  Maymie Brunke, Olegario Messier, RN Phone Number: 09/13/2019, 10:13 AM  Clinical Narrative:  Spoke to dtr in rm about HHC services currently. She will contact brother about [facility] agency. [facility] able [facility] accept [facility] 7/13. Will confirm with dtr decision since patient may stay in [place], then go back to [place].MD updated.     Expected Discharge Plan: Home w Home Health Services Barriers to Discharge: No Barriers Identified  Expected Discharge Plan and Services Expected Discharge Plan: Home w Home Health Services   Discharge Planning Services: CM Consult   Living arrangements for the past 2 months: Single Family Home Expected Discharge Date: 09/13/19                         HH Arranged: PT           Social Determinants of Health (SDOH) Interventions    Readmission Risk Interventions No flowsheet data found.

## 2019-09-13 NOTE — TOC Transition Note (Signed)
Transition of Care [facility]) - CM/SW Discharge Note   Patient Details  Name: Jeanette Arroyo MRN: [id number] Date of Birth: 1920/05/15  Transition of Care [facility]) CM/SW Contact:  Lanier Clam, RN Phone Number: 09/13/2019, 10:34 AM   Clinical Narrative:  D/c home with dtr Jeanette Arroyo to continue w/current [facility] agency [facility] [phone]-spoke to [name]. Jeanette Arroyo will have support @ [facility] home in [place] where patient will stay short term then go to patient's home in [place]. Faxed HHC orders to [facility].MD/patient/dtr agree to d/c plan.No further CM needs.     Final next level of care: Home w Home Health Services Barriers to Discharge: No Barriers Identified   Patient Goals and CMS Choice Patient states their goals for this hospitalization and ongoing recovery are:: i would like to go back to my home CMS Medicare.gov Compare Post Acute Care list provided to:: Patient Choice offered to / list presented to : Patient, Adult Children  Discharge Placement                       Discharge Plan and Services   Discharge Planning Services: CM Consult                      HH Arranged: PT          Social Determinants of Health (SDOH) Interventions     Readmission Risk Interventions No flowsheet data found.

## 2019-09-13 NOTE — Discharge Summary (Signed)
Physician Discharge Summary  Jeanette Arroyo KCM:[id number] DOB: [birth year] DOA: 09/08/2019  PCP: System, Provider Not In  Admit date: 09/08/2019 Discharge date: 09/13/2019  Admitted From: Home Disposition: Home   Recommendations for Outpatient Follow-up:  1. Follow up with [facility] for repeat monitoring of sodium level. Dr. Arlean Hopping has confirmed their ability to do so this week. Recommend full BMP due to low dose lasix as well. 2. Monitor BP. ARB held pending follow up and norvasc used as substitution with effective control while admitted.  3. Defer management of anxiety to PCP, discontinued SSRI due to SIADH.  Home Health: None new, will resume HH in [place] Equipment/Devices: None new Discharge Condition: Stable CODE STATUS: DNR Diet recommendation: 1000cc fluid restriction  Brief/Interim Summary: Jeanette Arroyo a 86 y.o.femalewith medical history significant ofTIA, chronic right BBB, asthma, breast cancers/pcompletion of chemotherapy in 2020, melanoma, and anxiety recently started on lexapro who presented 7/7 with dizziness and fatigue. On initial evaluation she was afebrile with stable vital signs. Labs showed sodium 117, chloride 83, creatinine 0.59, negative troponin, negative proBNP, unremarkable CBC, negative UA.EKG showed NSRwith RBBB. CXRshowed no acute changes. Patient received saline 500 cc bolus at the ED and was admitted with fluid restriction and salt tabs. Sodium level did not improve and pt's confusion remained, so with suspicion of SSRI-induced SIADH, lexapro was held and the patient was transferred to ICU with nephrology consultant overseeing hypertonic saline administration on 7/8. Sodium improved with normalization of mentation but after discontinuation, began downtrending again. Tolvaptan was administered 7/10 with improvement in sodium levels and stability of mental status. The patient was transferred to the floor later that evening. With fluid restriction, sodium level  has remained >130 and mental status is stable. She is discharged in stable condition with plans to take salt tablets, lasix, and follow up with nephrology within this next week prior to returning to [facility] where PCP will resume care.  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 Appears euvolemic. Cortisol and TSH within normal limits. - Sustained improvement after one dose of tolvaptan on 7/10. Urine osmolality remains (though improved) above serum osmolality. Lasix given per nephrology and will be continued at low dose for now until follow up.   - fluid restriction, tolerating this well thus far. - Continue salt tabs BID - LFTs remain wnl after tolvaptan. - Holding SSRI, added to allergy list. Escitalopram last dose 7/6 PM. Avoiding thiazide as well. Holding ARB as this may interfere with recovery from SIADH. Per Dr. Arlean Hopping on the day of discharge: "- ok for dc on NaCl 2 gm bid and lasix 10mg  qd x 2 wks supply, fluid restrict 1000/ day and our office will contact the daughter to set up office f/u within a week. Will sign off."  Anxiety:  - Stopped lexapro. No evidence of withdrawal - PCP follow up advised. Continuing very low dose po xanax prn  Hypocalcemia: Improved.  - Continue vitamin D supplement  History of TIA:  - Continue plavix, statin.   HTN:  - Diltiazem 30mg  q6h to be continued.  BP under reasonable control without hypotension. Has had isolated systolic hypertension which improved  with norvasc. Continue norvasc.  - Holding ARB. Of course, avoid thiazide.  Acute metabolic encephalopathy: Likely related to hyponatremia as this has improved. Query element of pseudodementia with concurrent mood disorder.  - Could consider outpatient neuropsychiatry evaluation, though mentation has improved significantly.   Triple negative right breast cancer s/p lumpectomy then mastectomy with recurrence, salvage XRT completed Nov 2020. Most recent note reports NED. - Continue oncology follow up as outpatient.   Melanoma s/p TVEC overseen by [facility].   PACs, chronic RBBB: Noted. No anginal complaints.  GERD:  - Protonix daily with plans to continue work up as outpatient.  Discharge Instructions Discharge Instructions    Discharge instructions   Complete by: As directed    To treat hyponatremia, take lasix 10mg  daily and salt tablets twice daily. STOP taking lexapro.  To treat high blood pressure, start taking norvasc as directed twice daily and STOP taking losartan for now. If you are not contacted by [facility] by tomorrow, please call their office to confirm a follow up appointment with labs this week.  If confusion develops, seek medical attention right away. Otherwise, follow up with nephrology as above and with your PCP in the next 2-4 weeks.     Allergies as of 09/13/2019      Reactions   Serotonin Reuptake Inhibitors (ssris) Other (See Comments)   Severe hyponatremia in July 2021 after starting Lexapro      Medication List    STOP taking these medications   escitalopram 5 MG tablet Commonly known as: LEXAPRO   losartan 50 MG tablet Commonly known as: COZAAR     TAKE these medications   ALPRAZolam 0.25 MG tablet Commonly known as: XANAX Take 0.125 mg by mouth daily as needed for anxiety.   amLODipine 10 MG tablet Commonly known as: NORVASC Take 0.5 tablets (5 mg total) by mouth 2 (two) times daily.   clopidogrel 75 MG tablet Commonly  known as: PLAVIX Take 1 tablet (75 mg total) by mouth at bedtime.   diltiazem 30 MG tablet Commonly known as: CARDIZEM Take 1 tablet (30 mg total) by mouth 4 (four) times daily. What changed:   when to take this  reasons to take this   furosemide 20 MG tablet Commonly known as: LASIX Take 0.5 tablets (10 mg total) by mouth daily. Start taking on: September 14, 2019   loratadine 10 MG tablet Commonly known as: CLARITIN Take 10 mg by mouth daily as needed. For allergies   nitroGLYCERIN 0.4 MG SL tablet Commonly known as: NITROSTAT Place 1 tablet (0.4 mg total) under the tongue every 5 (five) minutes as needed for chest pain.   ondansetron 4 MG disintegrating tablet Commonly known as: ZOFRAN-ODT Take 4 mg by mouth 5 (five) times daily as needed.   pantoprazole 40 MG tablet Commonly known as: PROTONIX Take 40 mg by mouth 2 (two) times daily.   sodium chloride 1 g tablet Take 2 tablets (2 g total) by mouth 2 (two) times daily with a meal.   Vitamin D3 50 MCG (2000 UT) capsule Take 2,000 Units by mouth daily.       Follow-up Information    [facility], September 16, 2019. Schedule an appointment as soon as possible for a visit.   Specialty: Nephrology Contact information: [address] [place] [phone]  Allergies  Allergen Reactions  . Serotonin Reuptake Inhibitors (Ssris) Other (See Comments)    Severe hyponatremia in July 2021 after starting Lexapro    Consultations:  Nephrology  Procedures/Studies: DG Chest 2 View  Result Date: 09/08/2019 CLINICAL DATA:  Irregular heart rate. EXAM: CHEST - 2 VIEW COMPARISON:  April 22, 2013. FINDINGS: Stable cardiomediastinal silhouette. No pneumothorax or pleural effusion is noted. Lungs are clear. Bony thorax is unremarkable. IMPRESSION: No active cardiopulmonary disease. Aortic Atherosclerosis (ICD10-I70.0). Electronically Signed   By: Lupita Raider M.D.   On:  09/08/2019 09:08   CT HEAD WO CONTRAST  Result Date: 09/08/2019 CLINICAL DATA:  Presyncope, unexplained. Fatigue, dizziness, lightheadedness, high blood pressure. EXAM: CT HEAD WITHOUT CONTRAST TECHNIQUE: Contiguous axial images were obtained from the base of the skull through the vertex without intravenous contrast. COMPARISON:  Prior head CT examination 01/12/2012 FINDINGS: Brain: Stable, mild generalized parenchymal atrophy. There is no acute intracranial hemorrhage. No demarcated cortical infarct. No extra-axial fluid collection. No evidence of intracranial mass. No midline shift. Vascular: No hyperdense vessel.  Atherosclerotic calcifications. Skull: Normal. Negative for fracture or focal lesion. Sinuses/Orbits: Visualized orbits show no acute finding. Mild ethmoid sinus mucosal thickening. No significant mastoid effusion IMPRESSION: No CT evidence of acute intracranial abnormality. Stable, mild generalized parenchymal atrophy. Mild ethmoid sinus mucosal thickening Electronically Signed   By: Jackey Loge DO   On: 09/08/2019 11:51     Subjective: No complaints, tolerating fluid restriction. Cognition clearing, near baseline.   Discharge Exam: Vitals:   09/12/19 2214 09/13/19 0529  BP: 110/67   Pulse: 71 75  Resp:    Temp: 97.6 F (36.4 C) 97.8 F (36.6 C)  SpO2: 97% 97%   General: Elderly, more interactive female, HOH, in no distress Cardiovascular: RRR, S1/S2 +, no rubs, no gallops Respiratory: CTA bilaterally, no wheezing, no rhonchi Abdominal: Soft, NT, ND, bowel sounds + Extremities: No edema, no cyanosis Neuro: Alert, oriented, follows directions, no focal deficits, speech wnl, remains HOH with no tremor.  Labs: BNP (last 3 results) Recent Labs    09/08/19 0833  BNP 40.2   Basic Metabolic Panel: Recent Labs  Lab 09/09/19 1745 09/09/19 2320 09/10/19 0232 09/10/19 1008 09/11/19 0705 09/11/19 1644 09/12/19 0536 09/12/19 1155 09/12/19 1638 09/12/19 2224  09/13/19 0548  NA 121*   < > 125*   < > 121*   < > 134* 132* 133* 137 133*  K 4.4  --  4.4  --  3.8  --  4.0  --   --   --  3.9  CL 90*  --  93*  --  90*  --  98  --   --   --  99  CO2 22  --  24  --  23  --  24  --   --   --  23  GLUCOSE 103*  --  101*  --  103*  --  112*  --   --   --  114*  BUN 14  --  12  --  12  --  14  --   --   --  18  CREATININE 0.66  --  0.68  --  0.55  --  0.68  --   --   --  0.69  CALCIUM 8.1*  --  8.2*  --  8.4*  --  9.1  --   --   --  8.7*   < > = values in this interval not  displayed.   Liver Function Tests: Recent Labs  Lab 09/08/19 0833 09/12/19 0536  AST 19 19  ALT 18 21  ALKPHOS 65 55  BILITOT 0.7 0.5  PROT 7.8 7.1  ALBUMIN 4.1 3.7   No results for input(s): LIPASE, AMYLASE in the last 168 hours. No results for input(s): AMMONIA in the last 168 hours. CBC: Recent Labs  Lab 09/08/19 0833 09/09/19 0130 09/12/19 0536  WBC 5.7 5.0 5.3  NEUTROABS  --  3.7  --   HGB 14.6 12.1 13.1  HCT 40.8 34.3* 38.5  MCV 87.0 87.1 90.4  PLT 320 273 286   Cardiac Enzymes: No results for input(s): CKTOTAL, CKMB, CKMBINDEX, TROPONINI in the last 168 hours. BNP: Invalid input(s): POCBNP CBG: No results for input(s): GLUCAP in the last 168 hours. D-Dimer No results for input(s): DDIMER in the last 72 hours. Hgb A1c No results for input(s): HGBA1C in the last 72 hours. Lipid Profile No results for input(s): CHOL, HDL, LDLCALC, TRIG, CHOLHDL, LDLDIRECT in the last 72 hours. Thyroid function studies No results for input(s): TSH, T4TOTAL, T3FREE, THYROIDAB in the last 72 hours.  Invalid input(s): FREET3 Anemia work up No results for input(s): VITAMINB12, FOLATE, FERRITIN, TIBC, IRON, RETICCTPCT in the last 72 hours. Urinalysis    Component Value Date/Time   COLORURINE YELLOW 09/08/2019 0950   APPEARANCEUR HAZY (A) 09/08/2019 0950   LABSPEC 1.011 09/08/2019 0950   PHURINE 7.0 09/08/2019 0950   GLUCOSEU 50 (A) 09/08/2019 0950   HGBUR NEGATIVE  09/08/2019 0950   BILIRUBINUR NEGATIVE 09/08/2019 0950   KETONESUR NEGATIVE 09/08/2019 0950   PROTEINUR 30 (A) 09/08/2019 0950   UROBILINOGEN 0.2 04/22/2013 2125   NITRITE NEGATIVE 09/08/2019 0950   LEUKOCYTESUR TRACE (A) 09/08/2019 0950    Microbiology Recent Results (from the past 240 hour(s))  SARS Coronavirus 2 by RT PCR (hospital order, performed in [facility] hospital lab) Nasopharyngeal Nasopharyngeal Swab     Status: None   Collection Time: 09/08/19 10:53 AM   Specimen: Nasopharyngeal Swab  Result Value Ref Range Status   SARS Coronavirus 2 NEGATIVE NEGATIVE Final    Comment: (NOTE) SARS-CoV-2 target nucleic acids are NOT DETECTED.  The SARS-CoV-2 RNA is generally detectable in upper and lower respiratory specimens during the acute phase of infection. The lowest concentration of SARS-CoV-2 viral copies this assay can detect is 250 copies / mL. A negative result does not preclude SARS-CoV-2 infection and should not be used as the sole basis for treatment or other patient management decisions.  A negative result may occur with improper specimen collection / handling, submission of specimen other than nasopharyngeal swab, presence of viral mutation(s) within the areas targeted by this assay, and inadequate number of viral copies (<250 copies / mL). A negative result must be combined with clinical observations, patient history, and epidemiological information.  Fact Sheet for Patients:   BoilerBrush.com.cy  Fact Sheet for Healthcare Providers: https://pope.com/  This test is not yet approved or  cleared by the [place] FDA and has been authorized for detection and/or diagnosis of SARS-CoV-2 by FDA under an Emergency Use Authorization (EUA).  This EUA will remain in effect (meaning this test can be used) for the duration of the COVID-19 declaration under Section 564(b)(1) of the Act, 21 U.S.C. section 360bbb-3(b)(1),  unless the authorization is terminated or revoked sooner.  Performed at [facility], [address], [place], [place] [address]   MRSA PCR Screening     Status: None  Collection Time: 09/09/19  9:39 AM   Specimen: Nasal Mucosa; Nasopharyngeal  Result Value Ref Range Status   MRSA by PCR NEGATIVE NEGATIVE Final    Comment:        The GeneXpert MRSA Assay (FDA approved for NASAL specimens only), is one component of a comprehensive MRSA colonization surveillance program. It is not intended to diagnose MRSA infection nor to guide or monitor treatment for MRSA infections. Performed at [facility], [address] [place], [place] [id number]     Time coordinating discharge: Approximately 40 minutes  Tyrone Nineyan B Jaedah Lords, MD  [facility] 09/13/2019, 9:29 AM

## 2020-06-02 DEATH — deceased

## 2021-08-11 IMAGING — CT CT HEAD W/O CM
3 series · 16 of 46 positions shown, 19 images · non-contrast
Comparison: Prior head CT examination 01/12/2012

CLINICAL DATA: Presyncope, unexplained. Fatigue, dizziness,
lightheadedness, high blood pressure.

EXAM:
CT HEAD WITHOUT CONTRAST
TECHNIQUE: Contiguous axial images were obtained from the base of the skull
through the vertex without intravenous contrast.

[Series 2: head wo · axial · 0.43mm/px · z∈[-125,-5]mm · 10 of 29 slices shown, 13 images]
[im 3/29  brain]
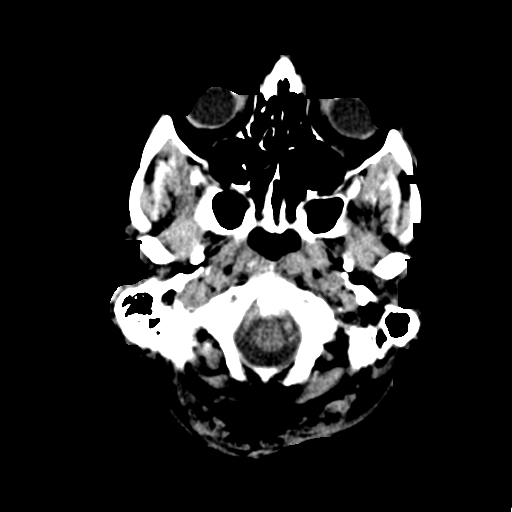
[im 3/29  bone]
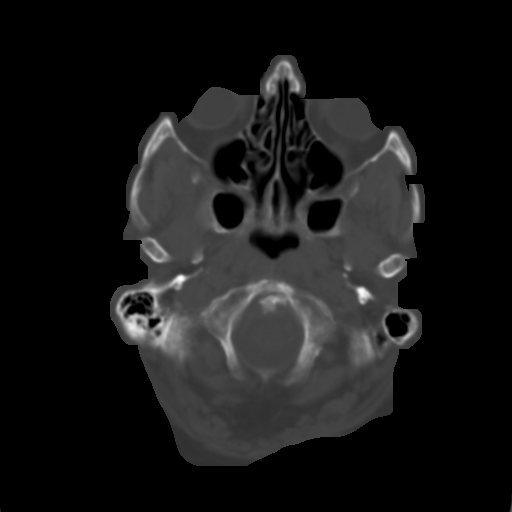
[im 6/29  brain]
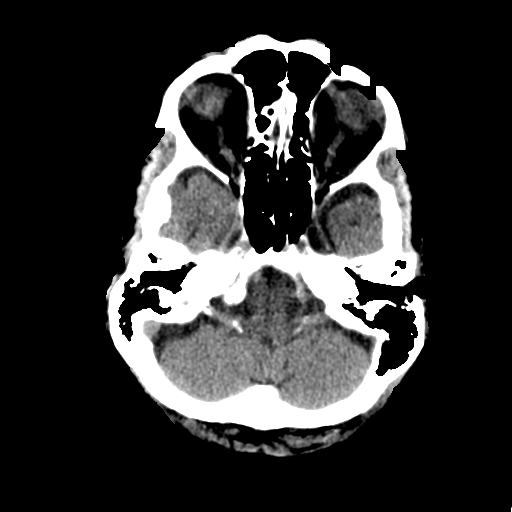
[im 8/29  brain]
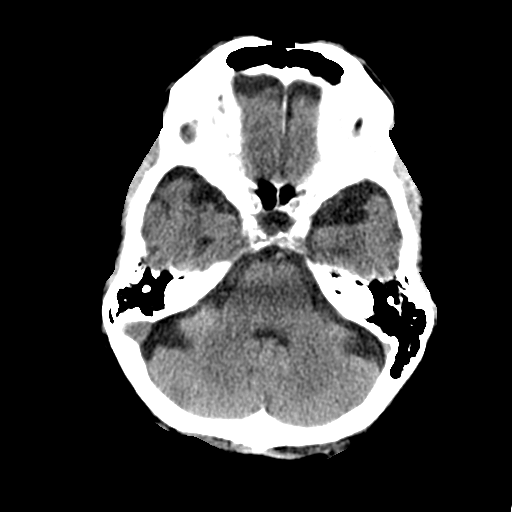
[im 11/29  brain]
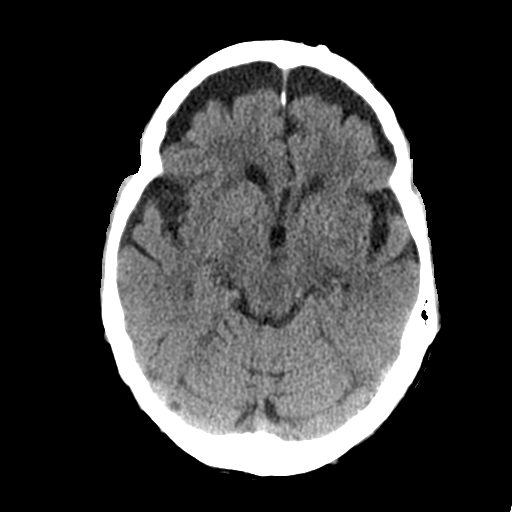
[im 14/29  brain]
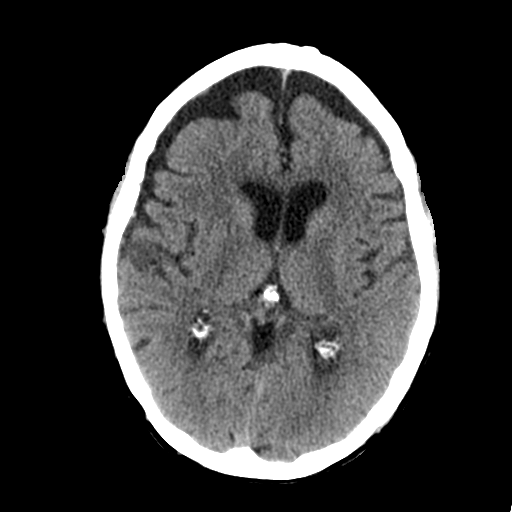
[im 14/29  bone]
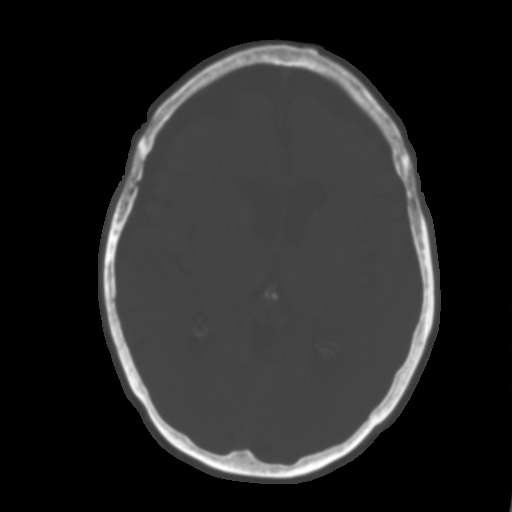
[im 16/29  brain]
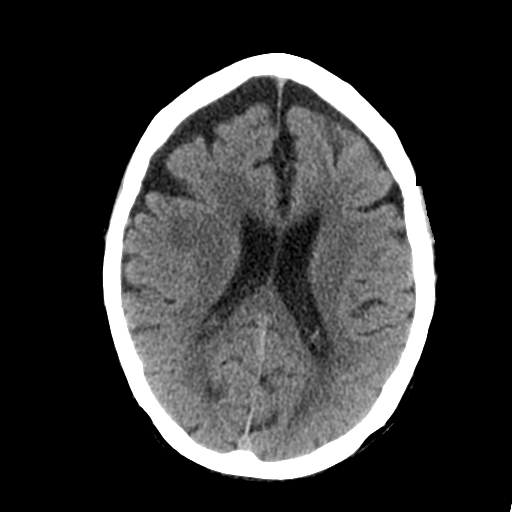
[im 19/29  brain]
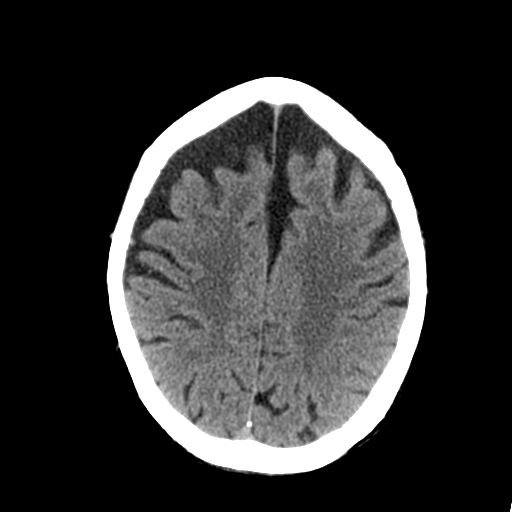
[im 22/29  brain]
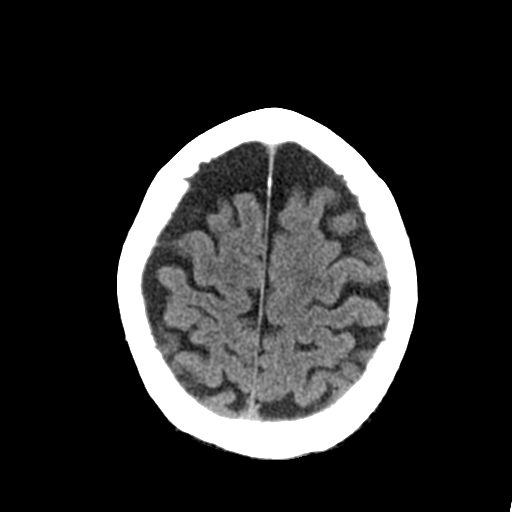
[im 24/29  brain]
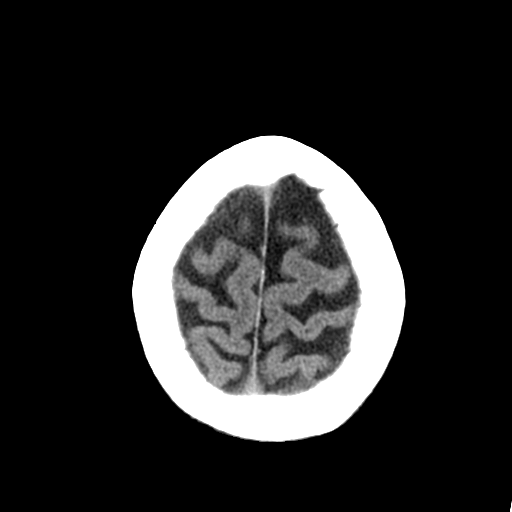
[im 24/29  bone]
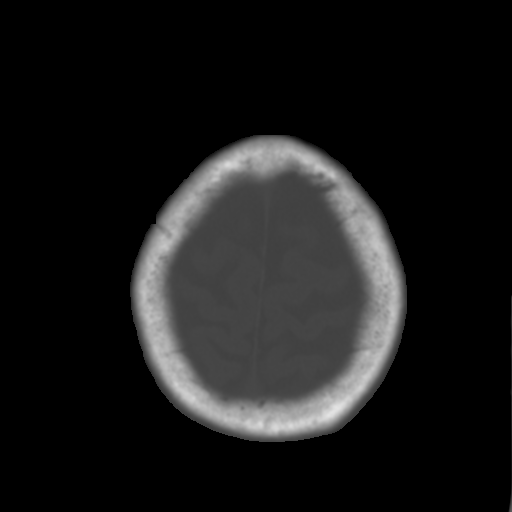
[im 27/29  brain]
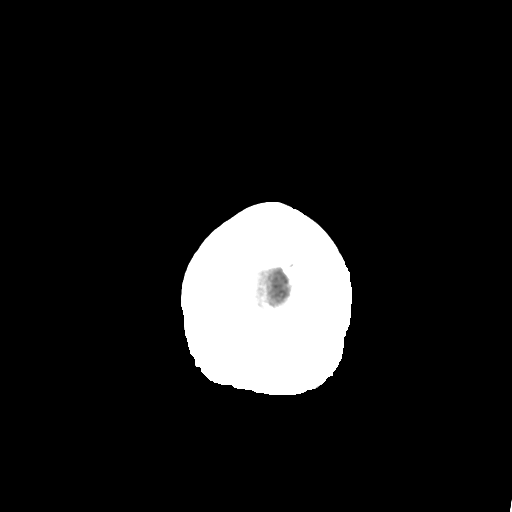

[Series 5: coronal soft tissue · coronal · 0.30mm/px · 3 of 66 slices shown]
[im 22/66  brain]
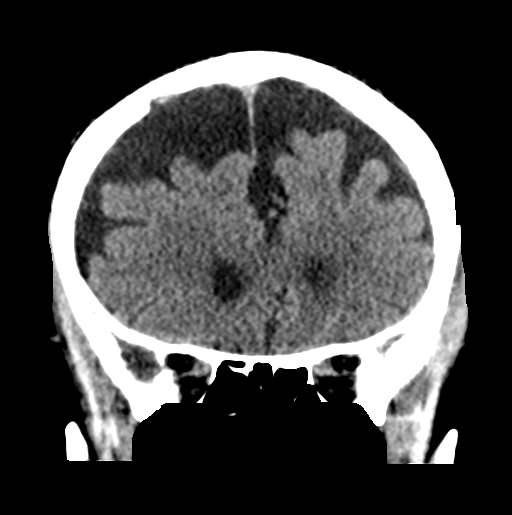
[im 29/66  brain]
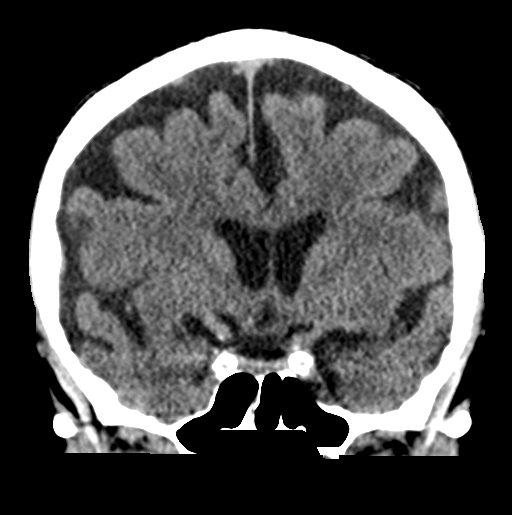
[im 37/66  brain]
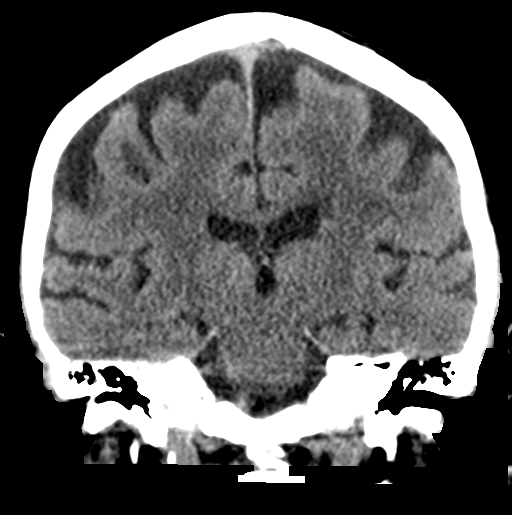

[Series 6: sagittal soft tissue · sagittal · 0.32mm/px · 3 of 50 slices shown]
[im 17/50  brain]
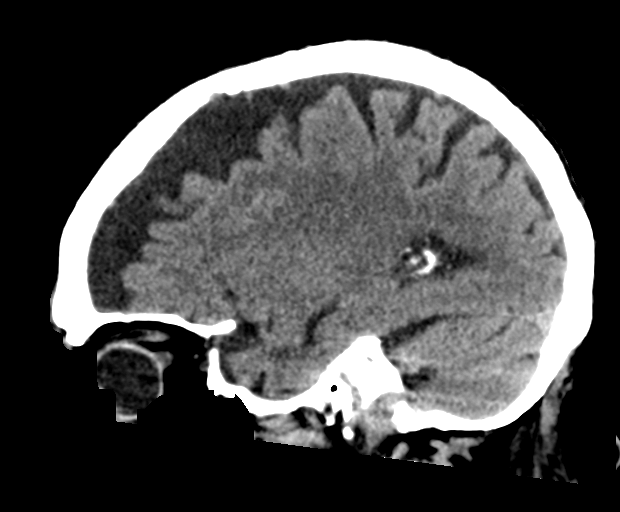
[im 25/50  brain]
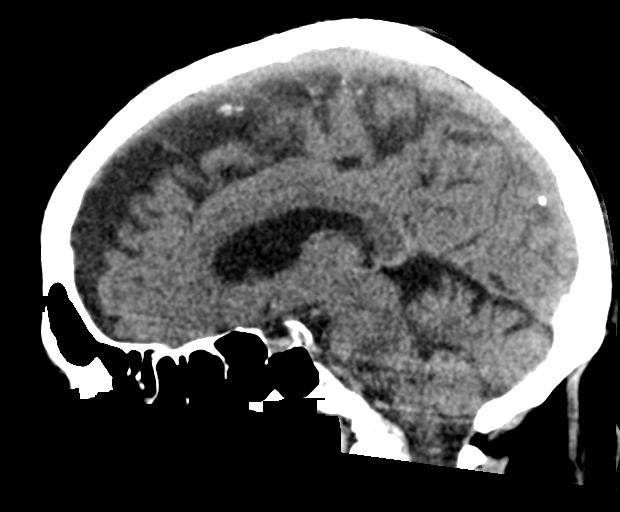
[im 33/50  brain]
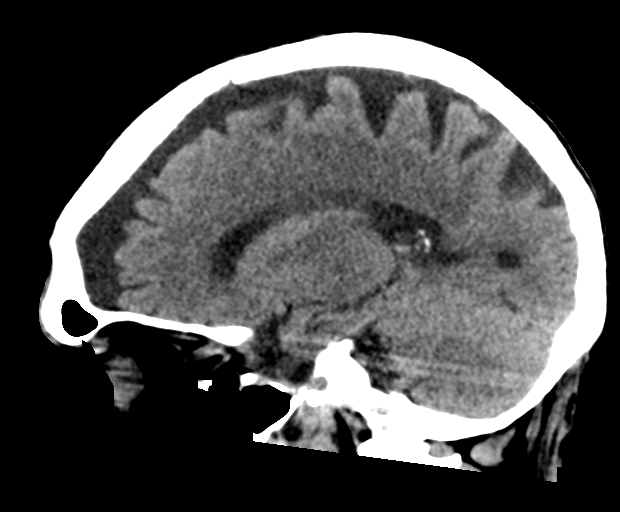

[16 of 46 positions shown; findings below may reference images not displayed]

FINDINGS: Brain:

Stable, mild generalized parenchymal atrophy.

There is no acute intracranial hemorrhage.

No demarcated cortical infarct.

No extra-axial fluid collection.

No evidence of intracranial mass.

No midline shift.

Vascular: No hyperdense vessel.  Atherosclerotic calcifications.

Skull: Normal. Negative for fracture or focal lesion.

Sinuses/Orbits: Visualized orbits show no acute finding. Mild
ethmoid sinus mucosal thickening. No significant mastoid effusion
IMPRESSION: No CT evidence of acute intracranial abnormality.

Stable, mild generalized parenchymal atrophy.

Mild ethmoid sinus mucosal thickening
